# Patient Record
Sex: Female | Born: 1940
Health system: Southern US, Community
[De-identification: ages and names within clinical notes are randomized; demographics above are authoritative.]

## PROBLEM LIST (undated history)

## (undated) DIAGNOSIS — I10 Essential (primary) hypertension: Secondary | ICD-10-CM

## (undated) DIAGNOSIS — T7840XA Allergy, unspecified, initial encounter: Secondary | ICD-10-CM

## (undated) DIAGNOSIS — K5792 Diverticulitis of intestine, part unspecified, without perforation or abscess without bleeding: Secondary | ICD-10-CM

## (undated) DIAGNOSIS — H269 Unspecified cataract: Secondary | ICD-10-CM

## (undated) DIAGNOSIS — M8580 Other specified disorders of bone density and structure, unspecified site: Secondary | ICD-10-CM

## (undated) DIAGNOSIS — E785 Hyperlipidemia, unspecified: Secondary | ICD-10-CM

## (undated) DIAGNOSIS — H409 Unspecified glaucoma: Secondary | ICD-10-CM

## (undated) DIAGNOSIS — M6530 Trigger finger, unspecified finger: Secondary | ICD-10-CM

## (undated) DIAGNOSIS — H9313 Tinnitus, bilateral: Secondary | ICD-10-CM

## (undated) DIAGNOSIS — H35342 Macular cyst, hole, or pseudohole, left eye: Secondary | ICD-10-CM

## (undated) HISTORY — DX: Other specified disorders of bone density and structure, unspecified site: M85.80

## (undated) HISTORY — DX: Unspecified glaucoma: H40.9

## (undated) HISTORY — DX: Trigger finger, unspecified finger: M65.30

## (undated) HISTORY — DX: Tinnitus, bilateral: H93.13

## (undated) HISTORY — DX: Allergy, unspecified, initial encounter: T78.40XA

## (undated) HISTORY — DX: Diverticulitis of intestine, part unspecified, without perforation or abscess without bleeding: K57.92

## (undated) HISTORY — DX: Unspecified cataract: H26.9

## (undated) HISTORY — DX: Hyperlipidemia, unspecified: E78.5

## (undated) HISTORY — PX: TRIGGER FINGER RELEASE: SHX641

## (undated) HISTORY — DX: Macular cyst, hole, or pseudohole, left eye: H35.342

## (undated) HISTORY — DX: Essential (primary) hypertension: I10

## (undated) HISTORY — PX: TUBAL LIGATION: SHX77

---

## 2005-03-05 ENCOUNTER — Ambulatory Visit: Payer: Self-pay | Admitting: Unknown Physician Specialty

## 2006-01-16 ENCOUNTER — Ambulatory Visit: Payer: Self-pay | Admitting: Internal Medicine

## 2007-05-27 DIAGNOSIS — M545 Low back pain, unspecified: Secondary | ICD-10-CM | POA: Insufficient documentation

## 2007-06-03 ENCOUNTER — Ambulatory Visit: Payer: Self-pay | Admitting: Family Medicine

## 2008-11-23 ENCOUNTER — Ambulatory Visit: Payer: Self-pay | Admitting: Family Medicine

## 2008-12-21 LAB — HM COLONOSCOPY

## 2009-01-11 ENCOUNTER — Ambulatory Visit: Payer: Self-pay | Admitting: Gastroenterology

## 2010-05-23 LAB — HM PAP SMEAR: HM PAP: NORMAL

## 2010-06-14 LAB — HM DEXA SCAN: HM Dexa Scan: NORMAL

## 2010-07-12 ENCOUNTER — Ambulatory Visit: Payer: Self-pay | Admitting: Family Medicine

## 2010-09-18 ENCOUNTER — Ambulatory Visit: Payer: Self-pay | Admitting: Family Medicine

## 2011-08-07 ENCOUNTER — Ambulatory Visit: Payer: Self-pay | Admitting: Family Medicine

## 2012-11-19 ENCOUNTER — Ambulatory Visit: Payer: Self-pay | Admitting: Family Medicine

## 2013-11-25 ENCOUNTER — Ambulatory Visit: Payer: Self-pay | Admitting: Family Medicine

## 2013-11-25 LAB — HM MAMMOGRAPHY: HM Mammogram: NORMAL

## 2014-08-10 LAB — LIPID PANEL
CHOLESTEROL: 216 mg/dL — AB (ref 0–200)
HDL: 81 mg/dL — AB (ref 35–70)
LDL CALC: 115 mg/dL
TRIGLYCERIDES: 98 mg/dL (ref 40–160)

## 2014-08-10 LAB — HEMOGLOBIN A1C: Hgb A1c MFr Bld: 6 % (ref 4.0–6.0)

## 2014-12-31 ENCOUNTER — Encounter: Payer: Self-pay | Admitting: Family Medicine

## 2014-12-31 ENCOUNTER — Ambulatory Visit (INDEPENDENT_AMBULATORY_CARE_PROVIDER_SITE_OTHER): Payer: BC Managed Care – PPO | Admitting: Family Medicine

## 2014-12-31 VITALS — BP 122/70 | HR 71 | Temp 98.6°F | Resp 14 | Ht 64.0 in | Wt 137.7 lb

## 2014-12-31 DIAGNOSIS — J309 Allergic rhinitis, unspecified: Secondary | ICD-10-CM | POA: Insufficient documentation

## 2014-12-31 DIAGNOSIS — J3089 Other allergic rhinitis: Secondary | ICD-10-CM

## 2014-12-31 DIAGNOSIS — E785 Hyperlipidemia, unspecified: Secondary | ICD-10-CM | POA: Diagnosis not present

## 2014-12-31 DIAGNOSIS — M1711 Unilateral primary osteoarthritis, right knee: Secondary | ICD-10-CM

## 2014-12-31 DIAGNOSIS — Z1331 Encounter for screening for depression: Secondary | ICD-10-CM | POA: Insufficient documentation

## 2014-12-31 DIAGNOSIS — G5600 Carpal tunnel syndrome, unspecified upper limb: Secondary | ICD-10-CM | POA: Insufficient documentation

## 2014-12-31 DIAGNOSIS — M6530 Trigger finger, unspecified finger: Secondary | ICD-10-CM | POA: Insufficient documentation

## 2014-12-31 DIAGNOSIS — M179 Osteoarthritis of knee, unspecified: Secondary | ICD-10-CM | POA: Insufficient documentation

## 2014-12-31 DIAGNOSIS — I499 Cardiac arrhythmia, unspecified: Secondary | ICD-10-CM | POA: Insufficient documentation

## 2014-12-31 DIAGNOSIS — I10 Essential (primary) hypertension: Secondary | ICD-10-CM

## 2014-12-31 DIAGNOSIS — R7301 Impaired fasting glucose: Secondary | ICD-10-CM

## 2014-12-31 DIAGNOSIS — H269 Unspecified cataract: Secondary | ICD-10-CM | POA: Insufficient documentation

## 2014-12-31 DIAGNOSIS — E2839 Other primary ovarian failure: Secondary | ICD-10-CM | POA: Insufficient documentation

## 2014-12-31 DIAGNOSIS — M8580 Other specified disorders of bone density and structure, unspecified site: Secondary | ICD-10-CM | POA: Insufficient documentation

## 2014-12-31 DIAGNOSIS — M1710 Unilateral primary osteoarthritis, unspecified knee: Secondary | ICD-10-CM | POA: Insufficient documentation

## 2014-12-31 MED ORDER — LOSARTAN POTASSIUM-HCTZ 50-12.5 MG PO TABS: 1.0000 | ORAL_TABLET | Freq: Every day | ORAL | Status: DC

## 2014-12-31 MED ORDER — CRESTOR 5 MG PO TABS: 5.0000 mg | ORAL_TABLET | Freq: Every day | ORAL | Status: DC

## 2014-12-31 MED ORDER — FLUTICASONE PROPIONATE 50 MCG/ACT NA SUSP: 2.0000 | Freq: Every day | NASAL | Status: DC

## 2014-12-31 MED ORDER — ASPIRIN EC 81 MG PO TBEC: 81.0000 mg | DELAYED_RELEASE_TABLET | Freq: Every day | ORAL | Status: AC

## 2014-12-31 MED ORDER — VITAMIN D 50 MCG (2000 UT) PO CAPS: 1.0000 | ORAL_CAPSULE | Freq: Every day | ORAL | Status: DC

## 2014-12-31 MED ORDER — LORATADINE 10 MG PO TABS: 10.0000 mg | ORAL_TABLET | Freq: Every day | ORAL | Status: DC

## 2014-12-31 NOTE — Progress Notes (Signed)
Name: Yvonne Andrews   MRN: [id number]    DOB: May 02, 1941   Date:12/31/2014       Progress Note  Subjective  Chief Complaint  Chief Complaint  Patient presents with  . Hypertension  . Hyperlipidemia  . Knee Pain    right     HPI  HTN:  Patient has been compliant with medication and denies side effects, no chest pain, no palpitation. No dry cough.   Hyperlipidemia: she had labs done 6 months ago, taking Crestor , in the past had elevated LFT's with Lipitor but tolerating Crestor well. Lipid panel at goal  Prediabetes/fasting hyperglycemia: fasting glucose was 104 and hgbA1C was 6.0 in January 2016, she denies polydipsia, polyuria or polyphagia.   OA right knee: she has intermittent pain on right medial knee , present when going down stairs. No effusion, no redness. Pain level at this time is zero. She take ibuprofen prn only, a few times weekly   AR: she notices hoarseness when exposed to fumes and cold air. Symptoms only lasts hours and resolves by itself. No sore throat associated, fever or dysphagia. Symptoms are going on for months. She also has intermittent congestion   Patient Active Problem List   Diagnosis Date Noted  . Benign essential HTN 12/31/2014  . Carpal tunnel syndrome 12/31/2014  . Dyslipidemia 12/31/2014  . Ovarian failure 12/31/2014  . Osteopenia 12/31/2014  . Allergic rhinitis 12/31/2014  . Arthritis of knee, degenerative 12/31/2014  . Acquired trigger finger 12/31/2014  . Cataracts, bilateral 12/31/2014  . LBP (low back pain) 05/27/2007    Past Surgical History  Procedure Laterality Date  . Tubal ligation      Family History  Problem Relation Age of Onset  . Heart disease Mother   . CAD Father   . Heart disease Father   . Cancer Brother     History   Social History  . Marital Status: Married    Spouse Name: N/A  . Number of Children: N/A  . Years of Education: N/A   Occupational History  . Not on file.   Social History Main Topics   . Smoking status: Never Smoker   . Smokeless tobacco: Never Used  . Alcohol Use: No  . Drug Use: No  . Sexual Activity: Not Currently   Other Topics Concern  . Not on file   Social History Narrative  . No narrative on file     Current outpatient prescriptions:  .  Calcium Carbonate 500 MG CHEW, Chew 1 tablet by mouth daily., Disp: , Rfl:  .  CRESTOR 5 MG tablet, Take 1 tablet (5 mg total) by mouth daily., Disp: 30 tablet, Rfl: 5 .  losartan-hydrochlorothiazide (HYZAAR) 50-12.5 MG per tablet, Take 1 tablet by mouth daily., Disp: 30 tablet, Rfl: 5 .  MULTIPLE VITAMINS-MINERALS ER PO, Take 1 tablet by mouth daily., Disp: , Rfl:  .  aspirin EC 81 MG tablet, Take 1 tablet (81 mg total) by mouth daily., Disp: 30 tablet, Rfl: 0 .  Cholecalciferol (VITAMIN D) 2000 UNITS CAPS, Take 1 capsule (2,000 Units total) by mouth daily., Disp: 30 capsule, Rfl: 0  Allergies  Allergen Reactions  . Lipitor [Atorvastatin] Other (See Comments)    Elevation of LFT's     ROS  Constitutional: Negative for fever or weight change.  Respiratory: Negative for cough and shortness of breath.   Cardiovascular: Negative for chest pain or palpitations.  Gastrointestinal: Negative for abdominal pain, no bowel changes.  Musculoskeletal: Negative for  gait problem or joint swelling.  Skin: Negative for rash.  Neurological: Negative for dizziness or headache.  No other specific complaints in a complete review of systems (except as listed in HPI above).  Objective  Filed Vitals:   12/31/14 1059  BP: 122/70  Pulse: 71  Temp: 98.6 F (37 C)  TempSrc: Oral  Resp: 14  Height:  (1.626 m)  Weight: 137 lb 11.2 oz (62.46 kg)  SpO2: 99%    Body mass index is 23.62 kg/(m^2).  Physical Exam  Constitutional: Patient appears well-developed and well-nourished. No distress.  HENT: Head: Normocephalic and atraumatic. Ears: B TMs ok, no erythema or effusion; Nose: Nose normal. Mouth/Throat: Oropharynx is  clear and moist. No oropharyngeal exudate.  Nares boggy turbinates Eyes: Conjunctivae and EOM are normal. Pupils are equal, round, and reactive to light. No scleral icterus.  Neck: Normal range of motion. Neck supple. No JVD present. No thyromegaly present.  Cardiovascular: Normal rate, regular rhythm and normal heart sounds.  No murmur heard. No BLE edema. Pulmonary/Chest: Effort normal and breath sounds normal. No respiratory distress. Abdominal: Soft.There is no tenderness. no masses Musculoskeletal: Normal range of motion, no joint effusions. Crepitus with extension of right knee, neg [place]ray Neurological: he is alert and oriented to person, place, and time. No cranial nerve deficit. Coordination, balance, strength, speech and gait are normal.  Skin: Skin is warm and dry. No rash noted. No erythema.  Psychiatric: Patient has a normal mood and affect. behavior is normal. Judgment and thought content normal.    PHQ2/9: Depression screen PHQ 2/9 12/31/2014  Decreased Interest 0  Down, Depressed, Hopeless 0  PHQ - 2 Score 0     Fall Risk: Fall Risk  12/31/2014  Falls in the past year? No     Assessment & Plan  1. Benign essential HTN  - losartan-hydrochlorothiazide (HYZAAR) 50-12.5 MG per tablet; Take 1 tablet by mouth daily.  Dispense: 30 tablet; Refill: 5  2. Other allergic rhinitis We will add medication and see if hoarseness resolves  fluticasone (FLONASE) 50 MCG/ACT nasal spray; Place 2 sprays into both nostrils daily.  Dispense: 16 g; Refill: 5 - loratadine (CLARITIN) 10 MG tablet; Take 1 tablet (10 mg total) by mouth daily.  Dispense: 30 tablet; Refill: 11  3. Dyslipidemia Continue medication - CRESTOR 5 MG tablet; Take 1 tablet (5 mg total) by mouth daily.  Dispense: 30 tablet; Refill: 5  4. Primary osteoarthritis of right knee Advised Tylenol prn   5. Hyperglycemia: discussed dietary modification and physical activity

## 2014-12-31 NOTE — Patient Instructions (Signed)
Diabetes Mellitus and Food It is important for you to manage your blood sugar (glucose) level. Your blood glucose level can be greatly affected by what you eat. Eating healthier foods in the appropriate amounts throughout the day at about the same time each day will help you control your blood glucose level. It can also help slow or prevent worsening of your diabetes mellitus. Healthy eating may even help you improve the level of your blood pressure and reach or maintain a healthy weight.  HOW CAN FOOD AFFECT ME? Carbohydrates Carbohydrates affect your blood glucose level more than any other type of food. Your dietitian will help you determine how many carbohydrates to eat at each meal and teach you how to count carbohydrates. Counting carbohydrates is important to keep your blood glucose at a healthy level, especially if you are using insulin or taking certain medicines for diabetes mellitus. Alcohol Alcohol can cause sudden decreases in blood glucose (hypoglycemia), especially if you use insulin or take certain medicines for diabetes mellitus. Hypoglycemia can be a life-threatening condition. Symptoms of hypoglycemia (sleepiness, dizziness, and disorientation) are similar to symptoms of having too much alcohol.  If your health care provider has given you approval to drink alcohol, do so in moderation and use the following guidelines:  Women should not have more than one drink per day, and men should not have more than two drinks per day. One drink is equal to:  12 oz of beer.  5 oz of wine.  1 oz of hard liquor.  Do not drink on an empty stomach.  Keep yourself hydrated. Have water, diet soda, or unsweetened iced tea.  Regular soda, juice, and other mixers might contain a lot of carbohydrates and should be counted. WHAT FOODS ARE NOT RECOMMENDED? As you make food choices, it is important to remember that all foods are not the same. Some foods have fewer nutrients per serving than other  foods, even though they might have the same number of calories or carbohydrates. It is difficult to get your body what it needs when you eat foods with fewer nutrients. Examples of foods that you should avoid that are high in calories and carbohydrates but low in nutrients include:  Trans fats (most processed foods list trans fats on the Nutrition Facts label).  Regular soda.  Juice.  Candy.  Sweets, such as cake, pie, doughnuts, and cookies.  Fried foods. WHAT FOODS CAN I EAT? Have nutrient-rich foods, which will nourish your body and keep you healthy. The food you should eat also will depend on several factors, including:  The calories you need.  The medicines you take.  Your weight.  Your blood glucose level.  Your blood pressure level.  Your cholesterol level. You also should eat a variety of foods, including:  Protein, such as meat, poultry, fish, tofu, nuts, and seeds (lean animal proteins are best).  Fruits.  Vegetables.  Dairy products, such as milk, cheese, and yogurt (low fat is best).  Breads, grains, pasta, cereal, rice, and beans.  Fats such as olive oil, trans fat-free margarine, canola oil, avocado, and olives. DOES EVERYONE WITH DIABETES MELLITUS HAVE THE SAME MEAL PLAN? Because every person with diabetes mellitus is different, there is not one meal plan that works for everyone. It is very important that you meet with a dietitian who will help you create a meal plan that is just right for you. Document Released: 04/05/2005 Document Revised: 07/14/2013 Document Reviewed: 06/05/2013 [employer] Patient Information 2015 [employer]. This   information is not intended to replace advice given to you by your health care provider. Make sure you discuss any questions you have with your health care provider.  

## 2015-01-04 ENCOUNTER — Other Ambulatory Visit: Payer: Self-pay | Admitting: Family Medicine

## 2015-01-04 DIAGNOSIS — Z1231 Encounter for screening mammogram for malignant neoplasm of breast: Secondary | ICD-10-CM

## 2015-01-12 ENCOUNTER — Other Ambulatory Visit: Payer: Self-pay | Admitting: Family Medicine

## 2015-01-12 ENCOUNTER — Ambulatory Visit
Admission: RE | Admit: 2015-01-12 | Discharge: 2015-01-12 | Disposition: A | Discharge status: Home / Self Care | Payer: BC Managed Care – PPO | Source: Ambulatory Visit | Attending: Family Medicine | Admitting: Family Medicine

## 2015-01-12 DIAGNOSIS — Z1231 Encounter for screening mammogram for malignant neoplasm of breast: Secondary | ICD-10-CM

## 2015-05-04 ENCOUNTER — Ambulatory Visit (INDEPENDENT_AMBULATORY_CARE_PROVIDER_SITE_OTHER): Payer: BC Managed Care – PPO

## 2015-05-04 DIAGNOSIS — Z23 Encounter for immunization: Secondary | ICD-10-CM

## 2015-06-26 ENCOUNTER — Other Ambulatory Visit: Payer: Self-pay | Admitting: Family Medicine

## 2015-07-04 ENCOUNTER — Ambulatory Visit (INDEPENDENT_AMBULATORY_CARE_PROVIDER_SITE_OTHER): Payer: Medicare Other | Admitting: Family Medicine

## 2015-07-04 ENCOUNTER — Encounter: Payer: Self-pay | Admitting: Family Medicine

## 2015-07-04 VITALS — BP 138/78 | HR 74 | Temp 97.8°F | Resp 16 | Ht 64.0 in | Wt 140.9 lb

## 2015-07-04 DIAGNOSIS — H9313 Tinnitus, bilateral: Secondary | ICD-10-CM | POA: Diagnosis not present

## 2015-07-04 DIAGNOSIS — R7301 Impaired fasting glucose: Secondary | ICD-10-CM

## 2015-07-04 DIAGNOSIS — M1711 Unilateral primary osteoarthritis, right knee: Secondary | ICD-10-CM

## 2015-07-04 DIAGNOSIS — E785 Hyperlipidemia, unspecified: Secondary | ICD-10-CM | POA: Diagnosis not present

## 2015-07-04 DIAGNOSIS — H40051 Ocular hypertension, right eye: Secondary | ICD-10-CM | POA: Diagnosis not present

## 2015-07-04 DIAGNOSIS — Z79899 Other long term (current) drug therapy: Secondary | ICD-10-CM

## 2015-07-04 DIAGNOSIS — I10 Essential (primary) hypertension: Secondary | ICD-10-CM | POA: Diagnosis not present

## 2015-07-04 DIAGNOSIS — H40059 Ocular hypertension, unspecified eye: Secondary | ICD-10-CM | POA: Insufficient documentation

## 2015-07-04 MED ORDER — LOSARTAN POTASSIUM-HCTZ 50-12.5 MG PO TABS: 1.0000 | ORAL_TABLET | Freq: Every day | ORAL | Status: DC

## 2015-07-04 MED ORDER — PRAVASTATIN SODIUM 40 MG PO TABS: 40.0000 mg | ORAL_TABLET | Freq: Every day | ORAL | Status: DC

## 2015-07-04 NOTE — Progress Notes (Signed)
Name: Yvonne Andrews   MRN: [id number]    DOB: 10-May-1941   Date:07/04/2015       Progress Note  Subjective  Chief Complaint  Chief Complaint  Patient presents with  . Medication Refill    follow-up  . Hypertension  . Hyperlipidemia    HPI  HTN: Patient has been compliant with medication and denies side effects, no chest pain, no palpitation, no dry cough. BP at home is well controlled, usually around 125/80's.   Hyperlipidemia: she had labs done 1 years ago, taking Crestor , in the past had elevated LFT's with Lipitor but tolerating Crestor well. Lipid panel at goal. She had insurance change and Crestor is tier 3 , she would like to change to another medication, discussed Pravastatin, and we will recheck liver enzymes today and on her next visit  Prediabetes/fasting hyperglycemia: fasting glucose was 104 and hgbA1C was 6.0 in January 2016, she denies polydipsia, polyuria or polyphagia. Not as active now because she retired the end of September, but will try to join [employer] in January.   OA right knee: she has intermittent pain on right medial knee , and over the right patella  when going down stairs. No effusion, no redness. Pain level at this time is zero. She take ibuprofen prn only, a few times weekly   Increase IO pressure: using eye drops, and has follow up with Dr. Margaretmary DysPortofilio in January  Patient Active Problem List   Diagnosis Date Noted  . Intraocular pressure increase 07/04/2015  . Benign essential HTN 12/31/2014  . Carpal tunnel syndrome 12/31/2014  . Dyslipidemia 12/31/2014  . Ovarian failure 12/31/2014  . Osteopenia 12/31/2014  . Allergic rhinitis 12/31/2014  . Arthritis of knee, degenerative 12/31/2014  . Acquired trigger finger 12/31/2014  . Cataracts, bilateral 12/31/2014  . Fasting hyperglycemia 12/31/2014  . LBP (low back pain) 05/27/2007    Past Surgical History  Procedure Laterality Date  . Tubal ligation      Family History  Problem Relation  Age of Onset  . Heart disease Mother   . CAD Father   . Heart disease Father   . Cancer Brother     Social History   Social History  . Marital Status: Married    Spouse Name: N/A  . Number of Children: N/A  . Years of Education: N/A   Occupational History  . Not on file.   Social History Main Topics  . Smoking status: Never Smoker   . Smokeless tobacco: Never Used  . Alcohol Use: No  . Drug Use: No  . Sexual Activity: Not Currently   Other Topics Concern  . Not on file   Social History Narrative     Current outpatient prescriptions:  .  aspirin EC 81 MG tablet, Take 1 tablet (81 mg total) by mouth daily., Disp: 30 tablet, Rfl: 0 .  Calcium Carbonate 500 MG CHEW, Chew 1 tablet by mouth daily., Disp: , Rfl:  .  Cholecalciferol (VITAMIN D) 2000 UNITS CAPS, Take 1 capsule (2,000 Units total) by mouth daily., Disp: 30 capsule, Rfl: 0 .  losartan-hydrochlorothiazide (HYZAAR) 50-12.5 MG tablet, Take 1 tablet by mouth daily., Disp: 90 tablet, Rfl: 1 .  MULTIPLE VITAMINS-MINERALS ER PO, Take 1 tablet by mouth daily., Disp: , Rfl:  .  pravastatin (PRAVACHOL) 40 MG tablet, Take 1 tablet (40 mg total) by mouth daily., Disp: 90 tablet, Rfl: 1 .  travoprost, benzalkonium, (TRAVATAN) 0.004 % ophthalmic solution, USE 1 DROP(S) IN RIGHT EYE  ONCE IN THE EVENING FOR 30 DAYS, Disp: , Rfl: 5  Allergies  Allergen Reactions  . Lipitor [Atorvastatin] Other (See Comments)    Elevation of LFT's     ROS  Constitutional: Negative for fever or significant  weight change.  Respiratory: Negative for cough and shortness of breath.   Cardiovascular: Negative for chest pain or palpitations.  Gastrointestinal: Negative for abdominal pain, no bowel changes.  Musculoskeletal: Negative for gait problem or joint swelling.  Skin: Negative for rash.  Neurological: Negative for dizziness or headache.  No other specific complaints in a complete review of systems (except as listed in HPI  above).  Objective  Filed Vitals:   07/04/15 0804  BP: 138/78  Pulse: 74  Temp: 97.8 F (36.6 C)  TempSrc: Oral  Resp: 16  Height:  (1.626 m)  Weight: 140 lb 14.4 oz (63.912 kg)  SpO2: 99%    Body mass index is 24.17 kg/(m^2).  Physical Exam  Constitutional: Patient appears well-developed and well-nourished. Obese  No distress.  HEENT: head atraumatic, normocephalic, pupils equal and reactive to light, neck supple, throat within normal limits Cardiovascular: Normal rate, regular rhythm and normal heart sounds.  No murmur heard. No BLE edema. Pulmonary/Chest: Effort normal and breath sounds normal. No respiratory distress. Abdominal: Soft.  There is no tenderness. Psychiatric: Patient has a normal mood and affect. behavior is normal. Judgment and thought content normal.   PHQ2/9: Depression screen [facility] 2/9 07/04/2015 12/31/2014  Decreased Interest 0 0  Down, Depressed, Hopeless 0 0  PHQ - 2 Score 0 0    Fall Risk: Fall Risk  07/04/2015 12/31/2014  Falls in the past year? No No     Functional Status Survey: Is the patient deaf or have difficulty hearing?: No Does the patient have difficulty seeing, even when wearing glasses/contacts?: Yes (glasses) Does the patient have difficulty concentrating, remembering, or making decisions?: No Does the patient have difficulty walking or climbing stairs?: No Does the patient have difficulty dressing or bathing?: No Does the patient have difficulty doing errands alone such as visiting a doctor's office or shopping?: No    Assessment & Plan  1. Benign essential HTN  - losartan-hydrochlorothiazide (HYZAAR) 50-12.5 MG tablet; Take 1 tablet by mouth daily.  Dispense: 90 tablet; Refill: 1  2. Primary osteoarthritis of right knee  Resume physical activity, continue prn Tylenol   3. Fasting hyperglycemia  - Hemoglobin A1c  4. Dyslipidemia  - Lipid panel - pravastatin (PRAVACHOL) 40 MG tablet; Take 1 tablet (40 mg  total) by mouth daily.  Dispense: 90 tablet; Refill: 1  5. Long-term use of high-risk medication  - AST - ALT - Estimated GFR  6. Intraocular pressure increase, right  Continue follow up with ophthalmologist   7. Bilateral tinnitus  Chronic, going on for many years. She thinks there is mild hearing loss, advised to see ENT, but she wants to hold off  for now. Discussed white noise

## 2015-07-24 DIAGNOSIS — H35342 Macular cyst, hole, or pseudohole, left eye: Secondary | ICD-10-CM

## 2015-07-24 HISTORY — DX: Macular cyst, hole, or pseudohole, left eye: H35.342

## 2015-08-07 ENCOUNTER — Other Ambulatory Visit: Payer: Self-pay | Admitting: Family Medicine

## 2015-08-10 ENCOUNTER — Other Ambulatory Visit: Payer: Self-pay

## 2015-08-10 MED ORDER — LOSARTAN POTASSIUM-HCTZ 50-12.5 MG PO TABS: 1.0000 | ORAL_TABLET | Freq: Every day | ORAL | Status: DC

## 2015-08-10 NOTE — Telephone Encounter (Signed)
Got a fax from [facility] ([address]) requesting a 90 day supply of this patient's Hyzaar 50/12.5.  Refill request was sent to Dr. Alba Cory for approval and submission.

## 2015-08-12 LAB — GLOM FILT RATE, ESTIMATED
CREATININE: 0.92 mg/dL (ref 0.57–1.00)
GFR calc Af Amer: 72 mL/min/{1.73_m2} (ref >59)
GFR, EST NON AFRICAN AMERICAN: 62 mL/min/{1.73_m2} (ref >59)

## 2015-08-12 LAB — AST: AST: 22 IU/L (ref 0–40)

## 2015-08-12 LAB — LIPID PANEL
CHOLESTEROL TOTAL: 193 mg/dL (ref 100–199)
Chol/HDL Ratio: 2.4 ratio units (ref 0.0–4.4)
HDL: 82 mg/dL (ref >39)
LDL Calculated: 96 mg/dL (ref 0–99)
TRIGLYCERIDES: 73 mg/dL (ref 0–149)
VLDL CHOLESTEROL CAL: 15 mg/dL (ref 5–40)

## 2015-08-12 LAB — HEMOGLOBIN A1C
Est. average glucose Bld gHb Est-mCnc: 128 mg/dL
HEMOGLOBIN A1C: 6.1 % — AB (ref 4.8–5.6)

## 2015-08-12 LAB — ALT: ALT: 13 IU/L (ref 0–32)

## 2015-09-04 ENCOUNTER — Other Ambulatory Visit: Payer: Self-pay | Admitting: Family Medicine

## 2015-09-19 ENCOUNTER — Ambulatory Visit (INDEPENDENT_AMBULATORY_CARE_PROVIDER_SITE_OTHER): Payer: Medicare Other | Admitting: Family Medicine

## 2015-09-19 ENCOUNTER — Encounter: Payer: Self-pay | Admitting: Family Medicine

## 2015-09-19 VITALS — BP 126/68 | HR 65 | Temp 98.0°F | Resp 16 | Ht 64.0 in | Wt 143.0 lb

## 2015-09-19 DIAGNOSIS — Z0000 Encounter for general adult medical examination without abnormal findings: Secondary | ICD-10-CM | POA: Diagnosis not present

## 2015-09-19 DIAGNOSIS — E2839 Other primary ovarian failure: Secondary | ICD-10-CM

## 2015-09-19 DIAGNOSIS — Z1239 Encounter for other screening for malignant neoplasm of breast: Secondary | ICD-10-CM

## 2015-09-19 DIAGNOSIS — E785 Hyperlipidemia, unspecified: Secondary | ICD-10-CM | POA: Diagnosis not present

## 2015-09-19 DIAGNOSIS — Z79899 Other long term (current) drug therapy: Secondary | ICD-10-CM | POA: Diagnosis not present

## 2015-09-19 NOTE — Patient Instructions (Signed)
  Ms. Gitlin , Thank you for taking time to come for your Medicare Wellness Visit. I appreciate your ongoing commitment to your health goals. Please review the following plan we discussed and let me know if I can assist you in the future.   These are the goals we discussed:  Check bone density coverage with insurance   This is a list of the screening recommended for you and due dates:  Health Maintenance  Topic Date Due  . Flu Shot  02/21/2016  . Mammogram  01/11/2017  . Colon Cancer Screening  12/22/2018  . Tetanus Vaccine  09/26/2020  . DEXA scan (bone density measurement)  Completed  . Shingles Vaccine  Completed  . Pneumonia vaccines  Completed

## 2015-09-19 NOTE — Progress Notes (Signed)
Name: Yvonne Andrews   MRN: [id number]    DOB: February 24, 1941   Date:09/19/2015       Progress Note  Subjective  Chief Complaint  Chief Complaint  Patient presents with  . Annual Exam    HPI  Functional ability/safety issues: No Issues - discussed light nights.  Hearing issues: Addressed  Activities of daily living: Discussed Home safety issues: No Issues  End Of Life Planning: Offered verbal information regarding advanced directives, healthcare power of attorney.  Preventative care, Health maintenance, Preventative health measures discussed.  Preventative screenings discussed today: lab work, colonoscopy, PAP - discussed USPTF and we will not longer check it, mammogram, DEXA.  Low Dose CT Chest recommended if Age 5-80 years, 30 pack-year currently smoking OR have quit w/in 15years.   Lifestyle risk factor issued reviewed: Diet, exercise, weight management, advised patient smoking is not healthy, nutrition/diet.  Preventative health measures discussed (5-10 year plan).  Reviewed and recommended vaccinations: - Pneumovax  - Prevnar  - Annual Influenza - Zostavax - Tdap   Depression screening: Done Fall risk screening: Done Discuss ADLs/IADLs: Done  Current medical providers: See HPI  Other health risk factors identified this visit: No other issues Cognitive impairment issues: None identified  All above discussed with patient. Appropriate education, counseling and referral will be made based upon the above.   Hyperlipidemia: we changed from Crestor to Pravastatin this month because of cost. We will recheck labs in a couple of months to make sure it is at goal . No chest pain or SOB, no decrease in exercise tolerance.  Patient Active Problem List   Diagnosis Date Noted  . Intraocular pressure increase 07/04/2015  . Bilateral tinnitus 07/04/2015  . Benign essential HTN 12/31/2014  . Carpal tunnel syndrome 12/31/2014  . Dyslipidemia 12/31/2014  . Ovarian failure  12/31/2014  . Osteopenia 12/31/2014  . Allergic rhinitis 12/31/2014  . Arthritis of knee, degenerative 12/31/2014  . Acquired trigger finger 12/31/2014  . Cataracts, bilateral 12/31/2014  . Fasting hyperglycemia 12/31/2014  . LBP (low back pain) 05/27/2007    Past Surgical History  Procedure Laterality Date  . Tubal ligation    . Trigger finger release      Family History  Problem Relation Age of Onset  . Heart disease Mother   . CAD Father   . Heart disease Father   . Cancer Brother     Social History   Social History  . Marital Status: Married    Spouse Name: N/A  . Number of Children: N/A  . Years of Education: N/A   Occupational History  . Not on file.   Social History Main Topics  . Smoking status: Never Smoker   . Smokeless tobacco: Never Used  . Alcohol Use: No  . Drug Use: No  . Sexual Activity: Not Currently   Other Topics Concern  . Not on file   Social History Narrative     Current outpatient prescriptions:  .  aspirin EC 81 MG tablet, Take 1 tablet (81 mg total) by mouth daily., Disp: 30 tablet, Rfl: 0 .  Calcium Carbonate 500 MG CHEW, Chew 1 tablet by mouth daily., Disp: , Rfl:  .  Cholecalciferol (VITAMIN D) 2000 UNITS CAPS, Take 1 capsule (2,000 Units total) by mouth daily., Disp: 30 capsule, Rfl: 0 .  latanoprost (XALATAN) 0.005 % ophthalmic solution, TAKE 1 DROP IN RIGHT EYE ONCE IN THE EVENING, Disp: , Rfl: 5 .  losartan-hydrochlorothiazide (HYZAAR) 50-12.5 MG tablet, TAKE 1 TABLET BY  MOUTH DAILY., Disp: 30 tablet, Rfl: 0 .  MULTIPLE VITAMINS-MINERALS ER PO, Take 1 tablet by mouth daily., Disp: , Rfl:  .  pravastatin (PRAVACHOL) 40 MG tablet, Take 1 tablet (40 mg total) by mouth daily., Disp: 90 tablet, Rfl: 1  Allergies  Allergen Reactions  . Lipitor [Atorvastatin] Other (See Comments)    Elevation of LFT's     ROS  Constitutional: Negative for fever or weight change.  Respiratory: Negative for cough and shortness of breath.    Cardiovascular: Negative for chest pain or palpitations.  Gastrointestinal: Negative for abdominal pain, no bowel changes.  Musculoskeletal: Negative for gait problem or joint swelling.  Skin: Negative for rash.  Neurological: Negative for dizziness or headache.  No other specific complaints in a complete review of systems (except as listed in HPI above).  Objective  Filed Vitals:   09/19/15 0828  BP: 126/68  Pulse: 65  Temp: 98 F (36.7 C)  TempSrc: Oral  Resp: 16  Height:  (1.626 m)  Weight: 143 lb (64.864 kg)  SpO2: 99%    Body mass index is 24.53 kg/(m^2).  Physical Exam  Constitutional: Patient appears well-developed and well-nourished. No distress.  HENT: Head: Normocephalic and atraumatic. Ears: B TMs ok, no erythema or effusion; Nose: Nose normal. Mouth/Throat: Oropharynx is clear and moist. No oropharyngeal exudate.  Eyes: Conjunctivae and EOM are normal. Pupils are equal, round, and reactive to light. No scleral icterus.  Neck: Normal range of motion. Neck supple. No JVD present. No thyromegaly present.  Cardiovascular: Normal rate, regular rhythm and normal heart sounds.  No murmur heard. No BLE edema. Pulmonary/Chest: Effort normal and breath sounds normal. No respiratory distress. Abdominal: Soft. Bowel sounds are normal, no distension. There is no tenderness. no masses Breast: no lumps or masses, no nipple discharge or rashes FEMALE GENITALIA:  External genitalia normal External urethra normal Vaginal atrophy Pelvic exam not done RECTAL: not done Musculoskeletal: Normal range of motion, no joint effusions. No gross deformities Neurological: he is alert and oriented to person, place, and time. No cranial nerve deficit. Coordination, balance, strength, speech and gait are normal.  Skin: Skin is warm and dry. No rash noted. No erythema.  Psychiatric: Patient has a normal mood and affect. behavior is normal. Judgment and thought content normal.  Recent  Results (from the past 2160 hour(s))  Lipid panel     Status: None   Collection Time: 08/11/15  8:13 AM  Result Value Ref Range   Cholesterol, Total 193 100 - 199 mg/dL   Triglycerides 73 0 - 149 mg/dL   HDL 82 >16 mg/dL   VLDL Cholesterol Cal 15 5 - 40 mg/dL   LDL Calculated 96 0 - 99 mg/dL   Chol/HDL Ratio 2.4 0.0 - 4.4 ratio units    Comment:                                   T. Chol/HDL Ratio                                             Men  Women                               1/2 Avg.Risk  3.4  3.3                                   Avg.Risk  5.0    4.4                                2X Avg.Risk  9.6    7.1                                3X Avg.Risk 23.4   11.0   Hemoglobin A1c     Status: Abnormal   Collection Time: 08/11/15  8:13 AM  Result Value Ref Range   Hgb A1c MFr Bld 6.1 (H) 4.8 - 5.6 %    Comment:          Pre-diabetes: 5.7 - 6.4          Diabetes: >6.4          Glycemic control for adults with diabetes: <7.0    Est. average glucose Bld gHb Est-mCnc 128 mg/dL  AST     Status: None   Collection Time: 08/11/15  8:13 AM  Result Value Ref Range   AST 22 0 - 40 IU/L  ALT     Status: None   Collection Time: 08/11/15  8:13 AM  Result Value Ref Range   ALT 13 0 - 32 IU/L  Glom Filt Rate, Estimated     Status: None   Collection Time: 08/11/15  8:13 AM  Result Value Ref Range   Creatinine, Ser 0.92 0.57 - 1.00 mg/dL   GFR calc non Af Amer 62 >59 mL/min/1.73   GFR calc Af Amer 72 >59 mL/min/1.73     PHQ2/9: Depression screen [facility] 2/9 09/19/2015 07/04/2015 12/31/2014  Decreased Interest 0 0 0  Down, Depressed, Hopeless 0 0 0  PHQ - 2 Score 0 0 0    Fall Risk: Fall Risk  09/19/2015 07/04/2015 12/31/2014  Falls in the past year? No No No    Functional Status Survey: Is the patient deaf or have difficulty hearing?: No Does the patient have difficulty seeing, even when wearing glasses/contacts?: No Does the patient have difficulty concentrating, remembering, or  making decisions?: No Does the patient have difficulty walking or climbing stairs?: No Does the patient have difficulty dressing or bathing?: No Does the patient have difficulty doing errands alone such as visiting a doctor's office or shopping?: No    Assessment & Plan  1. Medicare annual wellness visit, subsequent  Discussed importance of 150 minutes of physical activity weekly, eat two servings of fish weekly, eat one serving of tree nuts ( cashews, pistachios, pecans, almonds.Marland Kitchen) every other day, eat 6 servings of fruit/vegetables daily and drink plenty of water and avoid sweet beverages.   2. Dyslipidemia  - Lipid panel  3. Long-term use of high-risk medication  - AST - ALT  4. Breast cancer screening  - MM Digital Screening; Future  5. Ovarian failure  - DG Bone Density; Future

## 2015-10-02 ENCOUNTER — Other Ambulatory Visit: Payer: Self-pay | Admitting: Family Medicine

## 2015-11-11 DIAGNOSIS — Z79899 Other long term (current) drug therapy: Secondary | ICD-10-CM | POA: Diagnosis not present

## 2015-11-11 DIAGNOSIS — E785 Hyperlipidemia, unspecified: Secondary | ICD-10-CM | POA: Diagnosis not present

## 2015-11-12 LAB — AST: AST: 18 IU/L (ref 0–40)

## 2015-11-12 LAB — LIPID PANEL
CHOL/HDL RATIO: 3 ratio (ref 0.0–4.4)
Cholesterol, Total: 230 mg/dL — ABNORMAL HIGH (ref 100–199)
HDL: 77 mg/dL (ref >39)
LDL CALC: 135 mg/dL — AB (ref 0–99)
Triglycerides: 89 mg/dL (ref 0–149)
VLDL CHOLESTEROL CAL: 18 mg/dL (ref 5–40)

## 2015-11-12 LAB — ALT: ALT: 14 IU/L (ref 0–32)

## 2015-11-15 ENCOUNTER — Other Ambulatory Visit: Payer: Self-pay | Admitting: Family Medicine

## 2015-11-15 MED ORDER — ROSUVASTATIN CALCIUM 10 MG PO TABS: 10.0000 mg | ORAL_TABLET | Freq: Every day | ORAL | Status: DC

## 2015-11-16 ENCOUNTER — Other Ambulatory Visit: Payer: Self-pay | Admitting: Family Medicine

## 2015-11-16 DIAGNOSIS — Z79899 Other long term (current) drug therapy: Secondary | ICD-10-CM

## 2015-11-17 ENCOUNTER — Ambulatory Visit (INDEPENDENT_AMBULATORY_CARE_PROVIDER_SITE_OTHER): Payer: Medicare Other | Admitting: Family Medicine

## 2015-11-17 ENCOUNTER — Other Ambulatory Visit: Payer: Self-pay

## 2015-11-17 ENCOUNTER — Encounter: Payer: Self-pay | Admitting: Family Medicine

## 2015-11-17 VITALS — BP 118/68 | HR 69 | Temp 98.5°F | Resp 18 | Ht 64.0 in | Wt 143.3 lb

## 2015-11-17 DIAGNOSIS — E785 Hyperlipidemia, unspecified: Secondary | ICD-10-CM

## 2015-11-17 DIAGNOSIS — M1711 Unilateral primary osteoarthritis, right knee: Secondary | ICD-10-CM

## 2015-11-17 DIAGNOSIS — Z79899 Other long term (current) drug therapy: Secondary | ICD-10-CM

## 2015-11-17 DIAGNOSIS — H40051 Ocular hypertension, right eye: Secondary | ICD-10-CM

## 2015-11-17 DIAGNOSIS — R7301 Impaired fasting glucose: Secondary | ICD-10-CM | POA: Diagnosis not present

## 2015-11-17 DIAGNOSIS — I10 Essential (primary) hypertension: Secondary | ICD-10-CM

## 2015-11-17 MED ORDER — LOSARTAN POTASSIUM 50 MG PO TABS: 50.0000 mg | ORAL_TABLET | Freq: Every day | ORAL | Status: DC

## 2015-11-17 NOTE — Progress Notes (Signed)
Name: Yvonne Andrews   MRN: [id number]    DOB: [birth year]   Date:11/17/2015       Progress Note  Subjective  Chief Complaint  Chief Complaint  Patient presents with  . Hypertension    pt here for 4 month follow up    HPI  HTN: Patient has been compliant with medication and denies side effects, no chest pain, no palpitation, no dry cough. BP at home is running around 120's/60's, but 113/60 this morning.   Hyperlipidemia: she had labs done 1 years ago, taking Crestor , in the past had elevated LFT's with Lipitor but tolerating Crestor well. Lipid panel at goal. She had insurance change and Crestor is tier 3 , we changed to pravastatin because of cost, but LDL went up and she is back on Crestor 10 mg now - discussed trying half daily or take one every other day to off set cost  Prediabetes/fasting hyperglycemia: fasting glucose was 104 and hgbA1C was 6.1 this past January.  She denies polydipsia, polyuria or polyphagia.She has been walking more.   OA right knee: she has intermittent pain on right medial knee , and over the right patella when going down stairs. No effusion, no redness. Pain level at this time is zero. She take Tylenol prn very seldom.  Increase IO pressure: using eye drops, and has follow up with Dr. Margaretmary Dys coming up soon.   Patient Active Problem List   Diagnosis Date Noted  . Intraocular pressure increase 07/04/2015  . Bilateral tinnitus 07/04/2015  . Benign essential HTN 12/31/2014  . Carpal tunnel syndrome 12/31/2014  . Dyslipidemia 12/31/2014  . Ovarian failure 12/31/2014  . Osteopenia 12/31/2014  . Allergic rhinitis 12/31/2014  . Arthritis of knee, degenerative 12/31/2014  . Acquired trigger finger 12/31/2014  . Cataracts, bilateral 12/31/2014  . Fasting hyperglycemia 12/31/2014  . LBP (low back pain) 05/27/2007    Past Surgical History  Procedure Laterality Date  . Tubal ligation    . Trigger finger release      Family History  Problem Relation  Age of Onset  . Heart disease Mother   . CAD Father   . Heart disease Father   . Cancer Brother     Social History   Social History  . Marital Status: Married    Spouse Name: N/A  . Number of Children: N/A  . Years of Education: N/A   Occupational History  . Not on file.   Social History Main Topics  . Smoking status: Never Smoker   . Smokeless tobacco: Never Used  . Alcohol Use: No  . Drug Use: No  . Sexual Activity: Not Currently   Other Topics Concern  . Not on file   Social History Narrative     Current outpatient prescriptions:  .  aspirin EC 81 MG tablet, Take 1 tablet (81 mg total) by mouth daily., Disp: 30 tablet, Rfl: 0 .  Calcium Carbonate 500 MG CHEW, Chew 1 tablet by mouth daily., Disp: , Rfl:  .  Cholecalciferol (VITAMIN D) 2000 UNITS CAPS, Take 1 capsule (2,000 Units total) by mouth daily., Disp: 30 capsule, Rfl: 0 .  latanoprost (XALATAN) 0.005 % ophthalmic solution, TAKE 1 DROP IN RIGHT EYE ONCE IN THE EVENING, Disp: , Rfl: 5 .  losartan (COZAAR) 50 MG tablet, Take 1 tablet (50 mg total) by mouth daily., Disp: 90 tablet, Rfl: 3 .  MULTIPLE VITAMINS-MINERALS ER PO, Take 1 tablet by mouth daily., Disp: , Rfl:  .  rosuvastatin (  CRESTOR) 10 MG tablet, , Disp: , Rfl:   Allergies  Allergen Reactions  . Lipitor [Atorvastatin] Other (See Comments)    Elevation of LFT's     ROS  Ten systems reviewed and is negative except as mentioned in HPI   Objective  Filed Vitals:   11/17/15 0954  BP: 118/68  Pulse: 69  Temp: 98.5 F (36.9 C)  Resp: 18  Height:  (1.626 m)  Weight: 143 lb 5 oz (65.006 kg)  SpO2: 98%    Body mass index is 24.59 kg/(m^2).  Physical Exam  Constitutional: Patient appears well-developed and well-nourished.  No distress.  HEENT: head atraumatic, normocephalic, pupils equal and reactive to light,neck supple, throat within normal limits Cardiovascular: Normal rate, regular rhythm and normal heart sounds.  No murmur heard.  No BLE edema. Pulmonary/Chest: Effort normal and breath sounds normal. No respiratory distress. Abdominal: Soft.  There is no tenderness. Psychiatric: Patient has a normal mood and affect. behavior is normal. Judgment and thought content normal.  Recent Results (from the past 2160 hour(s))  Lipid panel     Status: Abnormal   Collection Time: 11/11/15  8:31 AM  Result Value Ref Range   Cholesterol, Total 230 (H) 100 - 199 mg/dL   Triglycerides 89 0 - 149 mg/dL   HDL 77 >16 mg/dL   VLDL Cholesterol Cal 18 5 - 40 mg/dL   LDL Calculated 109 (H) 0 - 99 mg/dL   Chol/HDL Ratio 3.0 0.0 - 4.4 ratio units    Comment:                                   T. Chol/HDL Ratio                                             Men  Women                               1/2 Avg.Risk  3.4    3.3                                   Avg.Risk  5.0    4.4                                2X Avg.Risk  9.6    7.1                                3X Avg.Risk 23.4   11.0   AST     Status: None   Collection Time: 11/11/15  8:31 AM  Result Value Ref Range   AST 18 0 - 40 IU/L  ALT     Status: None   Collection Time: 11/11/15  8:31 AM  Result Value Ref Range   ALT 14 0 - 32 IU/L     PHQ2/9: Depression screen [facility] 2/9 11/17/2015 09/19/2015 07/04/2015 12/31/2014  Decreased Interest 0 0 0 0  Down, Depressed, Hopeless 0 0 0 0  PHQ - 2 Score 0 0 0 0  Fall Risk: Fall Risk  11/17/2015 09/19/2015 07/04/2015 12/31/2014  Falls in the past year? No No No No    Functional Status Survey: Is the patient deaf or have difficulty hearing?: No Does the patient have difficulty seeing, even when wearing glasses/contacts?: No Does the patient have difficulty concentrating, remembering, or making decisions?: No Does the patient have difficulty walking or climbing stairs?: No Does the patient have difficulty dressing or bathing?: No Does the patient have difficulty doing errands alone such as visiting a doctor's office or shopping?:  No    Assessment & Plan  1. Benign essential HTN  Decreasing dose of bp medication, from Losartan/hctz to plain losartan because bp is towards low end of normal  - losartan (COZAAR) 50 MG tablet; Take 1 tablet (50 mg total) by mouth daily.  Dispense: 90 tablet; Refill: 3  2. Dyslipidemia  She is on Crestor 10 mg , used to be on  5mg  and was doing well, Pravastatin did not work, recheck AST and ALT in one month since we switched dose again and also medication and she has a previous history of elevated LFT's with Lipitor  3. Intraocular pressure increase, right  Keep with eye exams and mediation   4. Primary osteoarthritis of right knee  Continue walking and prn Tylenol

## 2015-12-01 ENCOUNTER — Other Ambulatory Visit: Payer: Self-pay | Admitting: Family Medicine

## 2015-12-13 DIAGNOSIS — Z79899 Other long term (current) drug therapy: Secondary | ICD-10-CM | POA: Diagnosis not present

## 2015-12-14 LAB — ALT: ALT: 14 IU/L (ref 0–32)

## 2015-12-14 LAB — AST: AST: 19 IU/L (ref 0–40)

## 2016-01-04 ENCOUNTER — Other Ambulatory Visit: Payer: Self-pay | Admitting: Family Medicine

## 2016-01-04 NOTE — Telephone Encounter (Signed)
Patient requesting refill. 

## 2016-01-16 ENCOUNTER — Ambulatory Visit
Admission: RE | Admit: 2016-01-16 | Discharge: 2016-01-16 | Disposition: A | Discharge status: Home / Self Care | Payer: Medicare Other | Source: Ambulatory Visit | Attending: Family Medicine | Admitting: Family Medicine

## 2016-01-16 ENCOUNTER — Other Ambulatory Visit: Payer: Self-pay | Admitting: Family Medicine

## 2016-01-16 DIAGNOSIS — M85852 Other specified disorders of bone density and structure, left thigh: Secondary | ICD-10-CM | POA: Diagnosis not present

## 2016-01-16 DIAGNOSIS — Z1231 Encounter for screening mammogram for malignant neoplasm of breast: Secondary | ICD-10-CM | POA: Insufficient documentation

## 2016-01-16 DIAGNOSIS — Z1239 Encounter for other screening for malignant neoplasm of breast: Secondary | ICD-10-CM

## 2016-01-16 DIAGNOSIS — M8588 Other specified disorders of bone density and structure, other site: Secondary | ICD-10-CM | POA: Diagnosis not present

## 2016-01-16 DIAGNOSIS — E2839 Other primary ovarian failure: Secondary | ICD-10-CM | POA: Insufficient documentation

## 2016-01-25 DIAGNOSIS — H2640 Unspecified secondary cataract: Secondary | ICD-10-CM | POA: Diagnosis not present

## 2016-03-12 ENCOUNTER — Other Ambulatory Visit: Payer: Self-pay | Admitting: Family Medicine

## 2016-04-25 DIAGNOSIS — H43813 Vitreous degeneration, bilateral: Secondary | ICD-10-CM | POA: Diagnosis not present

## 2016-04-25 DIAGNOSIS — H35342 Macular cyst, hole, or pseudohole, left eye: Secondary | ICD-10-CM | POA: Diagnosis not present

## 2016-04-27 DIAGNOSIS — H35342 Macular cyst, hole, or pseudohole, left eye: Secondary | ICD-10-CM | POA: Diagnosis not present

## 2016-05-09 DIAGNOSIS — H40053 Ocular hypertension, bilateral: Secondary | ICD-10-CM | POA: Diagnosis not present

## 2016-05-22 ENCOUNTER — Other Ambulatory Visit: Payer: Self-pay | Admitting: Family Medicine

## 2016-05-22 ENCOUNTER — Encounter: Payer: Self-pay | Admitting: Family Medicine

## 2016-05-22 ENCOUNTER — Ambulatory Visit (INDEPENDENT_AMBULATORY_CARE_PROVIDER_SITE_OTHER): Payer: Medicare Other | Admitting: Family Medicine

## 2016-05-22 VITALS — BP 110/62 | HR 55 | Temp 97.9°F | Resp 16 | Ht 64.0 in | Wt 139.3 lb

## 2016-05-22 DIAGNOSIS — Z79899 Other long term (current) drug therapy: Secondary | ICD-10-CM

## 2016-05-22 DIAGNOSIS — R001 Bradycardia, unspecified: Secondary | ICD-10-CM | POA: Diagnosis not present

## 2016-05-22 DIAGNOSIS — I10 Essential (primary) hypertension: Secondary | ICD-10-CM

## 2016-05-22 DIAGNOSIS — R8290 Unspecified abnormal findings in urine: Secondary | ICD-10-CM

## 2016-05-22 DIAGNOSIS — R634 Abnormal weight loss: Secondary | ICD-10-CM | POA: Diagnosis not present

## 2016-05-22 DIAGNOSIS — R7301 Impaired fasting glucose: Secondary | ICD-10-CM

## 2016-05-22 DIAGNOSIS — R1310 Dysphagia, unspecified: Secondary | ICD-10-CM | POA: Diagnosis not present

## 2016-05-22 DIAGNOSIS — E785 Hyperlipidemia, unspecified: Secondary | ICD-10-CM | POA: Diagnosis not present

## 2016-05-22 DIAGNOSIS — E559 Vitamin D deficiency, unspecified: Secondary | ICD-10-CM | POA: Diagnosis not present

## 2016-05-22 DIAGNOSIS — M1711 Unilateral primary osteoarthritis, right knee: Secondary | ICD-10-CM | POA: Diagnosis not present

## 2016-05-22 DIAGNOSIS — M8580 Other specified disorders of bone density and structure, unspecified site: Secondary | ICD-10-CM | POA: Diagnosis not present

## 2016-05-22 DIAGNOSIS — R1319 Other dysphagia: Secondary | ICD-10-CM

## 2016-05-22 LAB — CBC WITH DIFFERENTIAL/PLATELET
BASOS ABS: 0 {cells}/uL (ref 0–200)
Basophils Relative: 0 %
EOS PCT: 3 %
Eosinophils Absolute: 225 cells/uL (ref 15–500)
HCT: 41.3 % (ref 35.0–45.0)
HEMOGLOBIN: 13.4 g/dL (ref 11.7–15.5)
LYMPHS ABS: 1950 {cells}/uL (ref 850–3900)
Lymphocytes Relative: 26 %
MCH: 29.6 pg (ref 27.0–33.0)
MCHC: 32.4 g/dL (ref 32.0–36.0)
MCV: 91.2 fL (ref 80.0–100.0)
MONOS PCT: 7 %
MPV: 9.7 fL (ref 7.5–12.5)
Monocytes Absolute: 525 cells/uL (ref 200–950)
NEUTROS PCT: 64 %
Neutro Abs: 4800 cells/uL (ref 1500–7800)
PLATELETS: 228 10*3/uL (ref 140–400)
RBC: 4.53 MIL/uL (ref 3.80–5.10)
RDW: 14.8 % (ref 11.0–15.0)
WBC: 7.5 10*3/uL (ref 3.8–10.8)

## 2016-05-22 LAB — COMPLETE METABOLIC PANEL WITH GFR
ALBUMIN: 4.5 g/dL (ref 3.6–5.1)
ALK PHOS: 64 U/L (ref 33–130)
ALT: 11 U/L (ref 6–29)
AST: 17 U/L (ref 10–35)
BILIRUBIN TOTAL: 0.7 mg/dL (ref 0.2–1.2)
BUN: 11 mg/dL (ref 7–25)
CALCIUM: 10 mg/dL (ref 8.6–10.4)
CO2: 28 mmol/L (ref 20–31)
Chloride: 105 mmol/L (ref 98–110)
Creat: 0.95 mg/dL — ABNORMAL HIGH (ref 0.60–0.93)
GFR, EST NON AFRICAN AMERICAN: 60 mL/min (ref >=60)
GFR, Est African American: 69 mL/min (ref >=60)
Glucose, Bld: 104 mg/dL — ABNORMAL HIGH (ref 65–99)
POTASSIUM: 5.2 mmol/L (ref 3.5–5.3)
Sodium: 140 mmol/L (ref 135–146)
TOTAL PROTEIN: 7 g/dL (ref 6.1–8.1)

## 2016-05-22 LAB — LIPID PANEL
CHOLESTEROL: 187 mg/dL (ref 125–200)
HDL: 67 mg/dL (ref >=46)
LDL Cholesterol: 101 mg/dL (ref <130)
TRIGLYCERIDES: 93 mg/dL (ref <150)
Total CHOL/HDL Ratio: 2.8 Ratio (ref <=5.0)
VLDL: 19 mg/dL (ref <30)

## 2016-05-22 LAB — TSH: TSH: 2.21 mIU/L

## 2016-05-22 LAB — VITAMIN B12: VITAMIN B 12: 1503 pg/mL — AB (ref 200–1100)

## 2016-05-22 LAB — HEMOGLOBIN A1C
HEMOGLOBIN A1C: 5.8 % — AB (ref <5.7)
MEAN PLASMA GLUCOSE: 120 mg/dL

## 2016-05-22 NOTE — Progress Notes (Signed)
Name: Yvonne Andrews   MRN: [id number]    DOB: 1941/01/19   Date:05/22/2016       Progress Note  Subjective  Chief Complaint  Chief Complaint  Patient presents with  . Hypertension    pt here for 6 month follow up  . Hyperlipidemia    HPI  HTN: Patient has been compliant with medication and denies side effects, no chest pain, no palpitation. BP at home is running 114-148/67-76, usually 130's  Hyperlipidemia: she is back on  Crestor , in the past had elevated LFT's with Lipitor, we tried Pravastatin but did not work. No myalgias.  Prediabetes/fasting hyperglycemia:  hgbA1C was 6.1 this past January.  She denies polydipsia, polyuria or polyphagia.She has been walking more and also avoiding bread and has lost 5 lbs since last visit.   Weight loss: only dietary change is cutting down on bread, still eating every day, no fatigue.  Dysphagia: it happened once this am, when she drank water, she did not eat or take medications, it resolves as soon as she finished swallowing, advised to try some Tums or Maalox for now, call back if not better  OA right knee: she has intermittent pain on right medial knee , and over the right patella when going down stairs. No effusion, no redness. Pain level at this time is zero. She take Tylenol prn very seldom.  Patient Active Problem List   Diagnosis Date Noted  . Intraocular pressure increase 07/04/2015  . Bilateral tinnitus 07/04/2015  . Benign essential HTN 12/31/2014  . Carpal tunnel syndrome 12/31/2014  . Dyslipidemia 12/31/2014  . Ovarian failure 12/31/2014  . Osteopenia 12/31/2014  . Allergic rhinitis 12/31/2014  . Arthritis of knee, degenerative 12/31/2014  . Acquired trigger finger 12/31/2014  . Cataracts, bilateral 12/31/2014  . Fasting hyperglycemia 12/31/2014  . LBP (low back pain) 05/27/2007    Past Surgical History:  Procedure Laterality Date  . TRIGGER FINGER RELEASE    . TUBAL LIGATION      Family History  Problem  Relation Age of Onset  . Heart disease Mother   . CAD Father   . Heart disease Father   . Cancer Brother   . Breast cancer Neg Hx     Social History   Social History  . Marital status: Married    Spouse name: N/A  . Number of children: N/A  . Years of education: N/A   Occupational History  . Not on file.   Social History Main Topics  . Smoking status: Never Smoker  . Smokeless tobacco: Never Used  . Alcohol use No  . Drug use: No  . Sexual activity: Not Currently   Other Topics Concern  . Not on file   Social History Narrative  . No narrative on file     Current Outpatient Prescriptions:  .  Cholecalciferol (VITAMIN D) 2000 UNITS CAPS, Take 1 capsule (2,000 Units total) by mouth daily., Disp: 30 capsule, Rfl: 0 .  COMBIGAN 0.2-0.5 % ophthalmic solution, TAKE 1 DROP(S) IN BOTH EYES 2 TIMES A DAY -- PLEASE DISPENSE LARGEST QUANTITY ALLOWED BY INSURANCE, Disp: , Rfl: 3 .  losartan (COZAAR) 50 MG tablet, Take 1 tablet (50 mg total) by mouth daily., Disp: 90 tablet, Rfl: 3 .  MULTIPLE VITAMINS-MINERALS ER PO, Take 1 tablet by mouth daily., Disp: , Rfl:  .  rosuvastatin (CRESTOR) 10 MG tablet, TAKE 1 TABLET (10 MG TOTAL) BY MOUTH DAILY., Disp: 30 tablet, Rfl: 3 .  aspirin EC 81  MG tablet, Take 1 tablet (81 mg total) by mouth daily., Disp: 30 tablet, Rfl: 0 .  Calcium Carbonate 500 MG CHEW, Chew 1 tablet by mouth daily., Disp: , Rfl:  .  latanoprost (XALATAN) 0.005 % ophthalmic solution, TAKE 1 DROP IN RIGHT EYE ONCE IN THE EVENING, Disp: , Rfl: 5  Allergies  Allergen Reactions  . Lipitor [Atorvastatin] Other (See Comments)    Elevation of LFT's     ROS  Constitutional: Negative for fever , positive for weight change.  Respiratory: Positive  for mild cough, no  shortness of breath.   Cardiovascular: Negative for chest pain or palpitations.  Gastrointestinal: Negative for abdominal pain, no bowel changes.  Musculoskeletal: Negative for gait problem or joint swelling.   Skin: Negative for rash.  Neurological: Negative for dizziness or headache.  No other specific complaints in a complete review of systems (except as listed in HPI above).  Objective  Vitals:   05/22/16 0903  BP: 110/62  Pulse: (!) 55  Resp: 16  Temp: 97.9 F (36.6 C)  TempSrc: Oral  SpO2: 98%  Weight: 139 lb 5 oz (63.2 kg)  Height: 5\' 4"  (1.626 m)    Body mass index is 23.91 kg/m.  Physical Exam   Constitutional: Patient appears well-developed and well-nourished. No distress.  HEENT: head atraumatic, normocephalic, pupils equal and reactive to light,neck supple, throat within normal limits Cardiovascular: Normal rate, regular rhythm and normal heart sounds.  No murmur heard. No BLE edema. Pulmonary/Chest: Effort normal and breath sounds normal. No respiratory distress. Abdominal: Soft.  There is no tenderness. Negative CVA Psychiatric: Patient has a normal mood and affect. behavior is normal. Judgment and thought content normal.  PHQ2/9: [facility] 2/9 05/22/2016 11/17/2015 09/19/2015 07/04/2015 12/31/2014  Decreased Interest 0 0 0 0 0  Down, Depressed, Hopeless 0 0 0 0 0  PHQ - 2 Score 0 0 0 0 0    Fall Risk: Fall Risk  05/22/2016 11/17/2015 09/19/2015 07/04/2015 12/31/2014  Falls in the past year? No No No No No     Functional Status Survey: Is the patient deaf or have difficulty hearing?: No Does the patient have difficulty seeing, even when wearing glasses/contacts?: No Does the patient have difficulty concentrating, remembering, or making decisions?: No Does the patient have difficulty walking or climbing stairs?: No Does the patient have difficulty dressing or bathing?: No Does the patient have difficulty doing errands alone such as visiting a doctor's office or shopping?: No   Assessment & Plan  1. Benign essential HTN  At goal  - COMPLETE METABOLIC PANEL WITH GFR - EKG [id number]-Lead  2. Dyslipidemia  - Lipid panel - COMPLETE METABOLIC PANEL  WITH GFR - EKG 12-Lead  3. Long-term use of high-risk medication  - COMPLETE METABOLIC PANEL WITH GFR  4. Fasting hyperglycemia  - Hemoglobin A1c  5. Weight loss  Lost 5 lbs in the past 6 months, but she states she stopped taking carbs - CBC with Differential/Platelet - TSH - Vitamin B12 - VITAMIN D 25 Hydroxy (Vit-D Deficiency, Fractures)  6. Cloudy urine  - CULTURE, URINE COMPREHENSIVE  7. Osteopenia, unspecified location  - VITAMIN D 25 Hydroxy (Vit-D Deficiency, Fractures)  8. Esophageal dysphagia  Just happened once this am, after she drank water, advised otc tums or Maalox and if no improvement call back for upper GI   9. Primary osteoarthritis of right knee  Continue Tylenol prn    10. Bradycardia  - Ambulatory referral to Cardiology

## 2016-05-23 LAB — VITAMIN D 25 HYDROXY (VIT D DEFICIENCY, FRACTURES): VIT D 25 HYDROXY: 79 ng/mL (ref 30–100)

## 2016-05-24 LAB — CULTURE, URINE COMPREHENSIVE: Organism ID, Bacteria: NO GROWTH

## 2016-05-29 ENCOUNTER — Ambulatory Visit (INDEPENDENT_AMBULATORY_CARE_PROVIDER_SITE_OTHER): Payer: Medicare Other | Admitting: Internal Medicine

## 2016-05-29 ENCOUNTER — Encounter: Payer: Self-pay | Admitting: Internal Medicine

## 2016-05-29 VITALS — BP 148/70 | Ht 65.0 in | Wt 141.2 lb

## 2016-05-29 DIAGNOSIS — I10 Essential (primary) hypertension: Secondary | ICD-10-CM

## 2016-05-29 DIAGNOSIS — E559 Vitamin D deficiency, unspecified: Secondary | ICD-10-CM | POA: Insufficient documentation

## 2016-05-29 DIAGNOSIS — R001 Bradycardia, unspecified: Secondary | ICD-10-CM

## 2016-05-29 NOTE — Progress Notes (Signed)
New Outpatient Visit Date: 05/29/2016  Referring Provider: Alba CoryKrichna Sowles, MD [address] [place], [place] [id number]  Chief Complaint: Bradycardia  HPI:  [name] is a 75 y.o. year-old female with history of hyperlipidemia, hypertension, and impaired glucose tolerance, who has been referred by Dr. Carlynn PurlSowles for evaluation of bradycardia. The patient was recently evaluated by her primary care provider and was noted to have sinus bradycardia with a rate of 46 bpm by EKG. [name] denies having any symptoms including lightheadedness, fatigue, chest pain, or shortness of breath. She walks regularly and performs outdoor activities such as raking without any difficulties. She believes that her arms get a little more tired than they used to but overall feels about the same as she has for several years. She denies orthopnea, PND, and leg edema. She has never passed out. The patient denies a history of prior cardiovascular disease or evaluation. Her antihypertensive regimen was recently deescalated by her PCP. Otherwise, no medication changes have been made recently. The patient drinks two glasses of tea and one cup of decaffeinated coffee per day. She notes 1 or 2 episodes of brief palpitations a few months ago, which have not recurred. They only lasted for a few seconds with a sensation of fluttering in her chest. There were no associated symptoms including chest pain and lightheadedness.  --------------------------------------------------------------------------------------------------  Cardiovascular History & Procedures: Cardiovascular Problems:  Sinus bradycardia  Risk Factors:  Hypertension and age greater than 6665  Cath/PCI:  None  CV Surgery:  None  EP Procedures and Devices:  None  Non-Invasive Evaluation(s):  None  Recent CV Pertinent Labs: Lab Results  Component Value Date   CHOL 187 05/22/2016   CHOL 230 (H) 11/11/2015   HDL 67 05/22/2016   HDL 77 11/11/2015     LDLCALC 101 05/22/2016   LDLCALC 135 (H) 11/11/2015   TRIG 93 05/22/2016   CHOLHDL 2.8 05/22/2016   K 5.2 05/22/2016   BUN 11 05/22/2016   CREATININE 0.95 (H) 05/22/2016    --------------------------------------------------------------------------------------------------  Past Medical History:  Diagnosis Date  . Allergy   . Bilateral tinnitus   . Cataracts, bilateral   . Hyperlipidemia   . Hypertension   . Trigger finger of right hand     Past Surgical History:  Procedure Laterality Date  . TRIGGER FINGER RELEASE    . TUBAL LIGATION      Outpatient Encounter Prescriptions as of 05/29/2016  Medication Sig  . aspirin EC 81 MG tablet Take 1 tablet (81 mg total) by mouth daily.  . Calcium Carbonate 500 MG CHEW Chew 1 tablet by mouth daily.  . Cholecalciferol (VITAMIN D) 2000 UNITS CAPS Take 1 capsule (2,000 Units total) by mouth daily.  . COMBIGAN 0.2-0.5 % ophthalmic solution TAKE 1 DROP(S) IN BOTH EYES 2 TIMES A DAY -- PLEASE DISPENSE LARGEST QUANTITY ALLOWED BY INSURANCE  . losartan (COZAAR) 50 MG tablet Take 1 tablet (50 mg total) by mouth daily.  . MULTIPLE VITAMINS-MINERALS ER PO Take 1 tablet by mouth daily.  . Nutritional Supplements (ESTROVEN PM PO) Take by mouth daily.  . rosuvastatin (CRESTOR) 10 MG tablet TAKE 1 TABLET (10 MG TOTAL) BY MOUTH DAILY.  . [DISCONTINUED] latanoprost (XALATAN) 0.005 % ophthalmic solution TAKE 1 DROP IN RIGHT EYE ONCE IN THE EVENING   No facility-administered encounter medications on file as of 05/29/2016.     Allergies: Lipitor [atorvastatin]  Social History   Social History  . Marital status: Married    Spouse  name: N/A  . Number of children: N/A  . Years of education: N/A   Occupational History  . Not on file.   Social History Main Topics  . Smoking status: Never Smoker  . Smokeless tobacco: Never Used  . Alcohol use No  . Drug use: No  . Sexual activity: Not Currently   Other Topics Concern  . Not on file    Social History Narrative  . No narrative on file    Family History  Problem Relation Age of Onset  . Heart disease Mother   . CAD Father   . Heart attack Father 72  . Cancer Brother   . Breast cancer Neg Hx     Review of Systems: A 12-system review of systems was performed and was negative except as noted in the HPI.  --------------------------------------------------------------------------------------------------  Physical Exam: BP (!) 148/70   Ht 5\' 5"  (1.651 m)   Wt 141 lb 4 oz (64.1 kg)   BMI 23.51 kg/m   General:  Well-developed, well-nourished woman seated comfortably in the exam room. HEENT: No conjunctival pallor or scleral icterus.  Moist mucous membranes.  OP clear. Neck: Supple without lymphadenopathy, thyromegaly, JVD, or HJR.  No carotid bruit. Lungs: Normal work of breathing.  Clear to auscultation bilaterally without wheezes or crackles. Heart: Regular rate and rhythm without murmurs, rubs, or gallops.  Non-displaced PMI. Abd: Bowel sounds present.  Soft, NT/ND without hepatosplenomegaly Ext: No lower extremity edema.  Radial, PT, and DP pulses are 2+ bilaterally Skin: warm and dry without rash Neuro: CNIII-XII intact.  Strength and fine-touch sensation intact in upper and lower extremities bilaterally. Psych: Normal mood and affect.  EKG:  Sinus bradycardia (heart rate 54 bpm). Low voltage. Otherwise, no significant abnormalities.  The patient was ambulated through the office, which she did without any symptoms. Her heart rate rose to 74 bpm.  Lab Results  Component Value Date   WBC 7.5 05/22/2016   HGB 13.4 05/22/2016   HCT 41.3 05/22/2016   MCV 91.2 05/22/2016   PLT 228 05/22/2016    Lab Results  Component Value Date   NA 140 05/22/2016   K 5.2 05/22/2016   CL 105 05/22/2016   CO2 28 05/22/2016   BUN 11 05/22/2016   CREATININE 0.95 (H) 05/22/2016   GLUCOSE 104 (H) 05/22/2016   ALT 11 05/22/2016    Lab Results  Component Value Date    CHOL 187 05/22/2016   HDL 67 05/22/2016   LDLCALC 101 05/22/2016   TRIG 93 05/22/2016   CHOLHDL 2.8 05/22/2016   Lab Results  Component Value Date   TSH 2.21 05/22/2016   --------------------------------------------------------------------------------------------------  ASSESSMENT AND PLAN: Sinus bradycardia Patient is asymptomatic and demonstrates an appropriate chronotropic response to ambulation in the office today. Her EKG shows sinus rhythm without any PR prolongation or QRS widening. Low voltage is noted but nonspecific. Recent labs are unrevealing, including a normal TSH. The patient may have a low resting heart rate at baseline, which could be exacerbated by timolol eyedrops that she is using for glaucoma. This medication can be absorbed and demonstrate systemic effects. As such, I have encouraged the patient to speak with her PCP and ophthalmologist about alternative treatment options for her glaucoma that would allow discontinuation of timolol. No further workup is needed at this time. We will have the patient return in about 6 months to ensure that she has not developed any new conduction disease that would warrant further investigation or therapy.  Hypertension  Blood pressure is mildly elevated today.  Given recent change in blood pressure medication, I have encouraged the patient to keep monitoring her blood pressure at home and to speak with her PCP about further adjustments of her blood pressure is consistently above 140/90.  Follow-up: Return to clinic in 6 months  Yvonne Kendallhristopher Crespin Forstrom, MD 05/29/2016 8:23 PM

## 2016-05-29 NOTE — Patient Instructions (Addendum)
Medication Instructions:  Your physician recommends that you continue on your current medications as directed. Please refer to the Current Medication list given to you today.   Labwork: none  Testing/Procedures: none  Follow-Up: Your physician wants you to follow-up in: six months with Dr. Okey DupreEnd.  You will receive a reminder letter in the mail two months in advance. If you don't receive a letter, please call our office to schedule the follow-up appointment.   Any Other Special Instructions Will Be Listed Below (If Applicable). Please speak with your opthomologist regarding changing combigan eye drops     If you need a refill on your cardiac medications before your next appointment, please call your pharmacy.

## 2016-06-01 ENCOUNTER — Telehealth: Payer: Self-pay | Admitting: Family Medicine

## 2016-06-01 NOTE — Telephone Encounter (Signed)
Patient was told that her b-12 was elevated and would like to know what should she be doing for it.

## 2016-06-04 NOTE — Telephone Encounter (Signed)
She can stop taking supplementation if she is on vitamins, otherwise there is nothing to be done.

## 2016-06-18 DIAGNOSIS — H40053 Ocular hypertension, bilateral: Secondary | ICD-10-CM | POA: Diagnosis not present

## 2016-07-11 ENCOUNTER — Other Ambulatory Visit: Payer: Self-pay | Admitting: Family Medicine

## 2016-09-12 ENCOUNTER — Other Ambulatory Visit: Payer: Self-pay

## 2016-09-12 MED ORDER — ROSUVASTATIN CALCIUM 10 MG PO TABS: 10.0000 mg | ORAL_TABLET | Freq: Every day | ORAL | 0 refills | Status: DC

## 2016-09-12 NOTE — Telephone Encounter (Signed)
Patient requesting refill of [facility] to [facility].

## 2016-09-20 ENCOUNTER — Ambulatory Visit (INDEPENDENT_AMBULATORY_CARE_PROVIDER_SITE_OTHER): Payer: Medicare PPO | Admitting: Family Medicine

## 2016-09-20 ENCOUNTER — Encounter: Payer: Self-pay | Admitting: Family Medicine

## 2016-09-20 VITALS — BP 126/68 | HR 67 | Temp 98.0°F | Resp 16 | Ht 62.5 in | Wt 141.0 lb

## 2016-09-20 DIAGNOSIS — Z1231 Encounter for screening mammogram for malignant neoplasm of breast: Secondary | ICD-10-CM | POA: Diagnosis not present

## 2016-09-20 DIAGNOSIS — Z23 Encounter for immunization: Secondary | ICD-10-CM | POA: Diagnosis not present

## 2016-09-20 DIAGNOSIS — I10 Essential (primary) hypertension: Secondary | ICD-10-CM

## 2016-09-20 DIAGNOSIS — Z0000 Encounter for general adult medical examination without abnormal findings: Secondary | ICD-10-CM

## 2016-09-20 DIAGNOSIS — R7301 Impaired fasting glucose: Secondary | ICD-10-CM

## 2016-09-20 DIAGNOSIS — Z1239 Encounter for other screening for malignant neoplasm of breast: Secondary | ICD-10-CM

## 2016-09-20 DIAGNOSIS — E785 Hyperlipidemia, unspecified: Secondary | ICD-10-CM

## 2016-09-20 MED ORDER — LOSARTAN POTASSIUM 50 MG PO TABS: 50.0000 mg | ORAL_TABLET | Freq: Every day | ORAL | 3 refills | Status: DC

## 2016-09-20 NOTE — Progress Notes (Signed)
Name: Yvonne Andrews   MRN: [id number]    DOB: June 01, 1941   Date:09/20/2016       Progress Note  Subjective  Chief Complaint  Chief Complaint  Patient presents with  . Annual Exam    HPI   Functional ability/safety issues: No Issues  Hearing issues: Addressed  Activities of daily living: Discussed Home safety issues: No Issues  End Of Life Planning: Offered verbal information regarding advanced directives, healthcare power of attorney.  Preventative care, Health maintenance, Preventative health measures discussed.  Preventative screenings discussed today: lab work, colonoscopy, PAP - discussed USPTF and we will not longer check it, mammogram, DEXA.  Low Dose CT Chest recommended if Age 44-80 years, 30 pack-year currently smoking OR have quit w/in 15years.   Lifestyle risk factor issued reviewed: Diet, exercise, weight management, advised patient smoking is not healthy, nutrition/diet.  Preventative health measures discussed (5-10 year plan).  Reviewed and recommended vaccinations: - Pneumovax  - Prevnar  - Annual Influenza - Zostavax - Tdap   Depression screening: Done Fall risk screening: Done Discuss ADLs/IADLs: Done  Current medical providers: See HPI  Other health risk factors identified this visit: No other issues Cognitive impairment issues: None identified  All above discussed with patient. Appropriate education, counseling and referral will be made based upon the above.   HTN: Patient has been compliant with medication and denies side effects, no chest pain, no palpitation.   Hyperlipidemia: she is back on  Crestor , in the past had elevated LFT's with Lipitor, we tried Pravastatin but did not work. No myalgias. Last labs was at goal and LFT"s normal   Prediabetes/fasting hyperglycemia:  hgbA1C was 6.1 this past January, but down to 5.8% back in October.  She denies polydipsia, polyuria or polyphagia.Weight is stable  OA right knee: she has  intermittent pain on right medial knee , and over the right patella when going down stairs. No effusion, no redness. Pain level at this time is zero. She take Tylenol prn very seldom.   Patient Active Problem List   Diagnosis Date Noted  . Vitamin D deficiency 05/29/2016  . Intraocular pressure increase 07/04/2015  . Bilateral tinnitus 07/04/2015  . Benign essential HTN 12/31/2014  . Carpal tunnel syndrome 12/31/2014  . Dyslipidemia 12/31/2014  . Ovarian failure 12/31/2014  . Osteopenia 12/31/2014  . Allergic rhinitis 12/31/2014  . Arthritis of knee, degenerative 12/31/2014  . Acquired trigger finger 12/31/2014  . Cataracts, bilateral 12/31/2014  . Fasting hyperglycemia 12/31/2014  . LBP (low back pain) 05/27/2007    Past Surgical History:  Procedure Laterality Date  . TRIGGER FINGER RELEASE    . TUBAL LIGATION      Family History  Problem Relation Age of Onset  . Heart disease Mother   . CAD Father   . Heart attack Father [id number]  . Cancer Brother   . Breast cancer Neg Hx     Social History   Social History  . Marital status: Married    Spouse name: N/A  . Number of children: N/A  . Years of education: N/A   Occupational History  . Not on file.   Social History Main Topics  . Smoking status: Never Smoker  . Smokeless tobacco: Never Used  . Alcohol use No  . Drug use: No  . Sexual activity: Not Currently   Other Topics Concern  . Not on file   Social History Narrative  . No narrative on file     Current Outpatient Prescriptions:  .  aspirin EC 81 MG tablet, Take 1 tablet (81 mg total) by mouth daily., Disp: 30 tablet, Rfl: 0 .  brimonidine (ALPHAGAN) 0.2 % ophthalmic solution, , Disp: , Rfl:  .  Calcium Carbonate 500 MG CHEW, Chew 1 tablet by mouth daily., Disp: , Rfl:  .  Cholecalciferol (VITAMIN D) 2000 UNITS CAPS, Take 1 capsule (2,000 Units total) by mouth daily., Disp: 30 capsule, Rfl: 0 .  COMBIGAN 0.2-0.5 % ophthalmic solution, TAKE 1 DROP(S) IN  BOTH EYES 2 TIMES A DAY -- PLEASE DISPENSE LARGEST QUANTITY ALLOWED BY INSURANCE, Disp: , Rfl: 3 .  losartan (COZAAR) 50 MG tablet, Take 1 tablet (50 mg total) by mouth daily., Disp: 90 tablet, Rfl: 3 .  MULTIPLE VITAMINS-MINERALS ER PO, Take 1 tablet by mouth daily., Disp: , Rfl:  .  Nutritional Supplements (ESTROVEN PM PO), Take by mouth daily., Disp: , Rfl:  .  rosuvastatin (CRESTOR) 10 MG tablet, Take 1 tablet (10 mg total) by mouth daily., Disp: 90 tablet, Rfl: 0  Allergies  Allergen Reactions  . Lipitor [Atorvastatin] Other (See Comments)    Elevation of LFT's     ROS  Constitutional: Negative for fever or weight change.  Respiratory: Negative for cough and shortness of breath.   Cardiovascular: Negative for chest pain or palpitations.  Gastrointestinal: Negative for abdominal pain, no bowel changes.  Musculoskeletal: Negative for gait problem or joint swelling.  Skin: Negative for rash.  Neurological: Negative for dizziness or headache.  No other specific complaints in a complete review of systems (except as listed in HPI above).  Objective  Vitals:   09/20/16 0825  BP: 126/68  Pulse: 67  Resp: 16  Temp: 98 F (36.7 C)  TempSrc: Oral  SpO2: 98%  Weight: 141 lb (64 kg)  Height: 5' 2.5" (1.588 m)    Body mass index is 25.38 kg/m.  Physical Exam  Constitutional: Patient appears well-developed and well-nourished. No distress.  HENT: Head: Normocephalic and atraumatic. Ears: B TMs ok, no erythema or effusion; Nose: Nose normal. Mouth/Throat: Oropharynx is clear and moist. No oropharyngeal exudate.  Eyes: Conjunctivae and EOM are normal. Pupils are equal, round, and reactive to light. No scleral icterus.  Neck: Normal range of motion. Neck supple. No JVD present. No thyromegaly present.  Cardiovascular: Normal rate, regular rhythm and normal heart sounds.  No murmur heard. No BLE edema. Pulmonary/Chest: Effort normal and breath sounds normal. No respiratory  distress. Abdominal: Soft. Bowel sounds are normal, no distension. There is no tenderness. no masses Breast: no lumps or masses, no nipple discharge or rashes FEMALE GENITALIA:  Not done RECTAL: not done Musculoskeletal: Normal range of motion, no joint effusions. No gross deformities Neurological: he is alert and oriented to person, place, and time. No cranial nerve deficit. Coordination, balance, strength, speech and gait are normal.  Skin: Skin is warm and dry. No rash noted. No erythema.  Psychiatric: Patient has a normal mood and affect. behavior is normal. Judgment and thought content normal.  PHQ2/9: Depression screen [facility] 2/9 09/20/2016 05/22/2016 11/17/2015 09/19/2015 07/04/2015  Decreased Interest 0 0 0 0 0  Down, Depressed, Hopeless 0 0 0 0 0  PHQ - 2 Score 0 0 0 0 0     Fall Risk: Fall Risk  09/20/2016 05/22/2016 11/17/2015 09/19/2015 07/04/2015  Falls in the past year? No No No No No   Current Exercise Habits: Home exercise routine, Type of exercise: walking, Time (Minutes): 15, Frequency (Times/Week): 4, Weekly Exercise (Minutes/Week): 60, Intensity: Mild  Functional Status Survey: Is the patient deaf or have difficulty hearing?: No Does the patient have difficulty seeing, even when wearing glasses/contacts?: Yes (from left eye, may need surgery seeing Dr. Alinda Money) Does the patient have difficulty concentrating, remembering, or making decisions?: No Does the patient have difficulty walking or climbing stairs?: No Does the patient have difficulty dressing or bathing?: No Does the patient have difficulty doing errands alone such as visiting a doctor's office or shopping?: No    Assessment & Plan  1. Medicare annual wellness visit, subsequent  Discussed importance of 150 minutes of physical activity weekly, eat two servings of fish weekly, eat one serving of tree nuts ( cashews, pistachios, pecans, almonds.Marland Kitchen) every other day, eat 6 servings of fruit/vegetables daily  and drink plenty of water and avoid sweet beverages.   2. Breast cancer screening   Last one 2017 she will have it done in 2019  3. Fasting hyperglycemia  Continue life style modification   4. Benign essential HTN  - losartan (COZAAR) 50 MG tablet; Take 1 tablet (50 mg total) by mouth daily.  Dispense: 90 tablet; Refill: 3  5. Dyslipidemia  Continue medication

## 2016-09-21 ENCOUNTER — Encounter: Payer: Medicare Other | Admitting: Family Medicine

## 2016-10-05 ENCOUNTER — Other Ambulatory Visit: Payer: Self-pay

## 2016-10-05 DIAGNOSIS — I10 Essential (primary) hypertension: Secondary | ICD-10-CM

## 2016-10-05 NOTE — Telephone Encounter (Signed)
Patient is asking we sent in a 90 day supply instead of a 30 day supply to [facility].

## 2016-10-07 MED ORDER — LOSARTAN POTASSIUM 50 MG PO TABS: 50.0000 mg | ORAL_TABLET | Freq: Every day | ORAL | 1 refills | Status: DC

## 2016-11-14 ENCOUNTER — Other Ambulatory Visit: Payer: Self-pay | Admitting: Family Medicine

## 2016-11-16 ENCOUNTER — Ambulatory Visit: Payer: Medicare Other | Admitting: Family Medicine

## 2016-11-28 ENCOUNTER — Ambulatory Visit (INDEPENDENT_AMBULATORY_CARE_PROVIDER_SITE_OTHER): Payer: Medicare PPO | Admitting: Internal Medicine

## 2016-11-28 VITALS — BP 124/68 | HR 66 | Ht 65.0 in | Wt 140.0 lb

## 2016-11-28 DIAGNOSIS — I10 Essential (primary) hypertension: Secondary | ICD-10-CM

## 2016-11-28 DIAGNOSIS — K30 Functional dyspepsia: Secondary | ICD-10-CM

## 2016-11-28 DIAGNOSIS — R001 Bradycardia, unspecified: Secondary | ICD-10-CM

## 2016-11-28 NOTE — Progress Notes (Signed)
Follow-up Outpatient Visit Date: 11/28/2016  Primary Care Provider: Steele Sizer, [address] [place] [address]  Chief Complaint: Follow-up bradycardia  HPI:  Yvonne Andrews is a 76 y.o. year-old female with history of hypertension, hyperlipidemia, and impaired glucose tolerance, who presents for follow-up of asymptomatic sinus bradycardia. I met her in 05/2016, at which time she was asymptomatic but had been noted to have a heart rate in the 50s. I asked her to speak with her PCP and ophthalmologist about stopping timolol eyedrops. She was switched bromidine for management of her glaucoma. Today, [name] reports feeling well. She had one episode of "indigestion" last week at which time she had an uncomfortable sensation radiating from her epigastrium to her upper chest. This lasted about 10 minutes before resolving spontaneously. It occurred at rest, shortly after having eaten. She has not had any further chest pain or shortness of breath, including with activity. She also denies palpitations, lightheadedness, orthopnea, and PND. Her blood pressure has generally been well controlled at home with readings in the 120s/60s.  --------------------------------------------------------------------------------------------------  Cardiovascular History & Procedures: Cardiovascular Problems:  Sinus bradycardia  Risk Factors:  Hypertension, hyperlipidemia, and age greater than 71  Cath/PCI:  None  CV Surgery:  None  EP Procedures and Devices:  None  Non-Invasive Evaluation(s):  None  Recent CV Pertinent Labs: Lab Results  Component Value Date   CHOL 187 05/22/2016   CHOL 230 (H) 11/11/2015   HDL 67 05/22/2016   HDL 77 11/11/2015   LDLCALC 101 05/22/2016   LDLCALC 135 (H) 11/11/2015   TRIG 93 05/22/2016   CHOLHDL 2.8 05/22/2016   K 5.2 05/22/2016   BUN 11 05/22/2016   CREATININE 0.95 (H) 05/22/2016    Past medical and surgical history were reviewed  and updated in EPIC.  Outpatient Encounter Prescriptions as of 11/28/2016  Medication Sig  . aspirin EC 81 MG tablet Take 1 tablet (81 mg total) by mouth daily.  . brimonidine (ALPHAGAN) 0.2 % ophthalmic solution   . Calcium Carbonate 500 MG CHEW Chew 1 tablet by mouth daily.  . Cholecalciferol (VITAMIN D) 2000 UNITS CAPS Take 1 capsule (2,000 Units total) by mouth daily.  Marland Kitchen losartan (COZAAR) 50 MG tablet Take 1 tablet (50 mg total) by mouth daily.  . MULTIPLE VITAMINS-MINERALS ER PO Take 1 tablet by mouth daily.  . Nutritional Supplements (ESTROVEN PM PO) Take by mouth daily.  . rosuvastatin (CRESTOR) 10 MG tablet TAKE 1 TABLET EVERY DAY  . [DISCONTINUED] COMBIGAN 0.2-0.5 % ophthalmic solution TAKE 1 DROP(S) IN BOTH EYES 2 TIMES A DAY -- PLEASE DISPENSE LARGEST QUANTITY ALLOWED BY INSURANCE   No facility-administered encounter medications on file as of 11/28/2016.     Allergies: Lipitor [atorvastatin]  Social History   Social History  . Marital status: Married    Spouse name: N/A  . Number of children: N/A  . Years of education: N/A   Occupational History  . Not on file.   Social History Main Topics  . Smoking status: Never Smoker  . Smokeless tobacco: Never Used  . Alcohol use No  . Drug use: No  . Sexual activity: Not Currently   Other Topics Concern  . Not on file   Social History Narrative   She is married   She has four grand-children. Two of them live in [place] and two here in town.        Family History  Problem Relation Age of Onset  . Heart  disease Mother   . CAD Father   . Heart attack Father 64  . Cancer Brother   . Breast cancer Neg Hx     Review of Systems: A 12-system review of systems was performed and was negative except as noted in the HPI.  --------------------------------------------------------------------------------------------------  Physical Exam: BP 124/68 (BP Location: Left Arm, Patient Position: Sitting, Cuff Size: Normal)   Pulse  66   Ht 5' 5"  (1.651 m)   Wt 140 lb (63.5 kg)   BMI 23.30 kg/m   General:  Well-developed, well-nourished woman, seated comfortably in the exam room. HEENT: No conjunctival pallor or scleral icterus.  Moist mucous membranes.  OP clear. Neck: Supple without lymphadenopathy, thyromegaly, JVD, or HJR. Lungs: Normal work of breathing.  Clear to auscultation bilaterally without wheezes or crackles. Heart: Regular rate and rhythm without murmurs, rubs, or gallops.  Non-displaced PMI. Abd: Bowel sounds present.  Soft, NT/ND without hepatosplenomegaly Ext: No lower extremity edema.  Radial, PT, and DP pulses are 2+ bilaterally. Skin: warm and dry without rash  EKG:  Normal sinus rhythm with low voltage. No significant abnormalities. Heart rate has increased since 05/29/16.  Lab Results  Component Value Date   WBC 7.5 05/22/2016   HGB 13.4 05/22/2016   HCT 41.3 05/22/2016   MCV 91.2 05/22/2016   PLT 228 05/22/2016    Lab Results  Component Value Date   NA 140 05/22/2016   K 5.2 05/22/2016   CL 105 05/22/2016   CO2 28 05/22/2016   BUN 11 05/22/2016   CREATININE 0.95 (H) 05/22/2016   GLUCOSE 104 (H) 05/22/2016   ALT 11 05/22/2016    Lab Results  Component Value Date   CHOL 187 05/22/2016   HDL 67 05/22/2016   LDLCALC 101 05/22/2016   TRIG 93 05/22/2016   CHOLHDL 2.8 05/22/2016   Lab Results  Component Value Date   TSH 2.21 05/22/2016   --------------------------------------------------------------------------------------------------  ASSESSMENT AND PLAN: Sinus bradycardia Heart rate is normal today with discontinuation of . Patient is without symptoms. EKG demonstrates normal sinus rhythm with normal intervals. We have agreed to defer further workup at this time.  Hypertension Blood pressure is well controlled today. [name] should continue with losartan and follow-up with Dr. Ancil Boozer, as previously arranged.  Indigestion Single episode noted last week. [name] has not  had any additional symptoms including exertional chest pain or shortness of breath. I suspect her symptoms were likely GI in nature. However if she has recurrent chest pain, particularly with exertion, ischemia evaluation will need to be considered.  Follow-up: Return to clinic as needed.  Nelva Bush, MD 11/29/2016 7:40 AM

## 2016-11-28 NOTE — Patient Instructions (Signed)
Medication Instructions:  Your physician recommends that you continue on your current medications as directed. Please refer to the Current Medication list given to you today.   Labwork: none  Testing/Procedures: none  Follow-Up: Your physician recommends that you schedule a follow-up appointment as needed with Dr. Okey DupreEnd.    Any Other Special Instructions Will Be Listed Below (If Applicable).     If you need a refill on your cardiac medications before your next appointment, please call your pharmacy.

## 2016-11-29 ENCOUNTER — Encounter: Payer: Self-pay | Admitting: Internal Medicine

## 2016-12-03 ENCOUNTER — Telehealth: Payer: Self-pay | Admitting: Family Medicine

## 2016-12-03 NOTE — Telephone Encounter (Signed)
Pt is interested in taking a new supplement called Move Free for the joints, cartilage and bones. She wanted to know if it is safe for her to take. Please return call [phone]

## 2016-12-04 NOTE — Telephone Encounter (Signed)
It should be fine, chondroitin, glucosamine and vitamin D

## 2016-12-04 NOTE — Telephone Encounter (Signed)
Patient notified

## 2017-01-17 ENCOUNTER — Other Ambulatory Visit: Payer: Self-pay | Admitting: Family Medicine

## 2017-01-17 NOTE — Telephone Encounter (Signed)
Patient requesting refill of Crestor to [facility].

## 2017-01-21 ENCOUNTER — Encounter: Payer: Self-pay | Admitting: Family Medicine

## 2017-01-21 ENCOUNTER — Ambulatory Visit (INDEPENDENT_AMBULATORY_CARE_PROVIDER_SITE_OTHER): Payer: Medicare PPO | Admitting: Family Medicine

## 2017-01-21 VITALS — BP 132/84 | HR 60 | Temp 98.5°F | Resp 16 | Ht 65.0 in | Wt 138.2 lb

## 2017-01-21 DIAGNOSIS — R634 Abnormal weight loss: Secondary | ICD-10-CM

## 2017-01-21 DIAGNOSIS — E559 Vitamin D deficiency, unspecified: Secondary | ICD-10-CM

## 2017-01-21 DIAGNOSIS — R7301 Impaired fasting glucose: Secondary | ICD-10-CM | POA: Diagnosis not present

## 2017-01-21 DIAGNOSIS — I10 Essential (primary) hypertension: Secondary | ICD-10-CM

## 2017-01-21 DIAGNOSIS — E785 Hyperlipidemia, unspecified: Secondary | ICD-10-CM | POA: Diagnosis not present

## 2017-01-21 DIAGNOSIS — Z1231 Encounter for screening mammogram for malignant neoplasm of breast: Secondary | ICD-10-CM

## 2017-01-21 DIAGNOSIS — Z79899 Other long term (current) drug therapy: Secondary | ICD-10-CM | POA: Diagnosis not present

## 2017-01-21 LAB — COMPLETE METABOLIC PANEL WITH GFR
ALT: 12 U/L (ref 6–29)
AST: 20 U/L (ref 10–35)
Albumin: 4.4 g/dL (ref 3.6–5.1)
Alkaline Phosphatase: 79 U/L (ref 33–130)
BUN: 8 mg/dL (ref 7–25)
CALCIUM: 9.9 mg/dL (ref 8.6–10.4)
CHLORIDE: 105 mmol/L (ref 98–110)
CO2: 23 mmol/L (ref 20–31)
CREATININE: 0.92 mg/dL (ref 0.60–0.93)
GFR, Est African American: 71 mL/min (ref >=60)
GFR, Est Non African American: 62 mL/min (ref >=60)
Glucose, Bld: 97 mg/dL (ref 65–99)
Potassium: 4.7 mmol/L (ref 3.5–5.3)
Sodium: 141 mmol/L (ref 135–146)
Total Bilirubin: 0.5 mg/dL (ref 0.2–1.2)
Total Protein: 7.1 g/dL (ref 6.1–8.1)

## 2017-01-21 LAB — CBC WITH DIFFERENTIAL/PLATELET
BASOS PCT: 1 %
Basophils Absolute: 53 cells/uL (ref 0–200)
EOS ABS: 265 {cells}/uL (ref 15–500)
EOS PCT: 5 %
HCT: 40.9 % (ref 35.0–45.0)
Hemoglobin: 12.7 g/dL (ref 11.7–15.5)
LYMPHS PCT: 35 %
Lymphs Abs: 1855 cells/uL (ref 850–3900)
MCH: 28.6 pg (ref 27.0–33.0)
MCHC: 31.1 g/dL — ABNORMAL LOW (ref 32.0–36.0)
MCV: 92.1 fL (ref 80.0–100.0)
MONOS PCT: 6 %
MPV: 9.3 fL (ref 7.5–12.5)
Monocytes Absolute: 318 cells/uL (ref 200–950)
Neutro Abs: 2809 cells/uL (ref 1500–7800)
Neutrophils Relative %: 53 %
PLATELETS: 253 10*3/uL (ref 140–400)
RBC: 4.44 MIL/uL (ref 3.80–5.10)
RDW: 14.2 % (ref 11.0–15.0)
WBC: 5.3 10*3/uL (ref 3.8–10.8)

## 2017-01-21 LAB — LIPID PANEL
CHOLESTEROL: 173 mg/dL (ref <200)
HDL: 66 mg/dL (ref >50)
LDL Cholesterol: 86 mg/dL (ref <100)
Total CHOL/HDL Ratio: 2.6 Ratio (ref <5.0)
Triglycerides: 107 mg/dL (ref <150)
VLDL: 21 mg/dL (ref <30)

## 2017-01-21 LAB — TSH: TSH: 1.74 mIU/L

## 2017-01-21 MED ORDER — LOSARTAN POTASSIUM 50 MG PO TABS: 50.0000 mg | ORAL_TABLET | Freq: Every day | ORAL | 1 refills | Status: DC

## 2017-01-21 MED ORDER — ROSUVASTATIN CALCIUM 10 MG PO TABS: 10.0000 mg | ORAL_TABLET | Freq: Every day | ORAL | 2 refills | Status: DC

## 2017-01-21 NOTE — Progress Notes (Signed)
Name: Yvonne Andrews   MRN: [id number]    DOB: 11/26/1940   Date:01/21/2017       Progress Note  Subjective  Chief Complaint  Chief Complaint  Patient presents with  . Hypertension    4 month follow up    HPI  HTN: Patient has been compliant with medication and denies side effects, no chest pain, no palpitation, seen by Dr. Okey Dupre - cardiologist in May 2018 and EKG showed sinus rhythm, therefore no further testing was done for bradycardia.   Hyperlipidemia: she has been on Crestor , in the past had elevated LFT's with Lipitor, we tried Pravastatin but did not work. No myalgias. Last labs was at goal and LFT"s normal , we will recheck labs today   Prediabetes/fasting hyperglycemia: hgbA1C was 6.24 July 2015, but down to 5.8% back in October 2018, she has changed her diet, cutting down on breads, also on sweets.   She denies polydipsia, polyuria or polyphagia. She has lost 4 lbs since last visit, but has been seems to go up and down since 12/2014   OA right knee: she has intermittent pain on right medial knee , and over the right patella when going down stairs. No effusion, no redness. Pain level at this time is zero. She take Tylenol prn very seldom.  Vitamin D deficiency: taking otc supplementation  Patient Active Problem List   Diagnosis Date Noted  . Vitamin D deficiency 05/29/2016  . Intraocular pressure increase 07/04/2015  . Bilateral tinnitus 07/04/2015  . Benign essential HTN 12/31/2014  . Carpal tunnel syndrome 12/31/2014  . Dyslipidemia 12/31/2014  . Ovarian failure 12/31/2014  . Osteopenia 12/31/2014  . Allergic rhinitis 12/31/2014  . Arthritis of knee, degenerative 12/31/2014  . Acquired trigger finger 12/31/2014  . Cataracts, bilateral 12/31/2014  . Fasting hyperglycemia 12/31/2014  . LBP (low back pain) 05/27/2007    Past Surgical History:  Procedure Laterality Date  . TRIGGER FINGER RELEASE    . TUBAL LIGATION      Family History  Problem Relation Age  of Onset  . Heart disease Mother   . CAD Father   . Heart attack Father 67  . Cancer Brother   . Breast cancer Neg Hx     Social History   Social History  . Marital status: Married    Spouse name: N/A  . Number of children: N/A  . Years of education: N/A   Occupational History  . Not on file.   Social History Main Topics  . Smoking status: Never Smoker  . Smokeless tobacco: Never Used  . Alcohol use No  . Drug use: No  . Sexual activity: Not Currently   Other Topics Concern  . Not on file   Social History Narrative   She is married   She has four grand-children. Two of them live in [place] and two here in town.         Current Outpatient Prescriptions:  .  aspirin EC 81 MG tablet, Take 1 tablet (81 mg total) by mouth daily., Disp: 30 tablet, Rfl: 0 .  Calcium Carbonate 500 MG CHEW, Chew 1 tablet by mouth daily., Disp: , Rfl:  .  Cholecalciferol (VITAMIN D) 2000 UNITS CAPS, Take 1 capsule (2,000 Units total) by mouth daily., Disp: 30 capsule, Rfl: 0 .  losartan (COZAAR) 50 MG tablet, Take 1 tablet (50 mg total) by mouth daily., Disp: 90 tablet, Rfl: 1 .  MULTIPLE VITAMINS-MINERALS ER PO, Take 1 tablet by mouth daily.,  Disp: , Rfl:  .  Nutritional Supplements (ESTROVEN PM PO), Take by mouth daily., Disp: , Rfl:  .  rosuvastatin (CRESTOR) 10 MG tablet, Take 1 tablet (10 mg total) by mouth daily., Disp: 90 tablet, Rfl: 2 .  timolol (TIMOPTIC) 0.5 % ophthalmic solution, , Disp: , Rfl:   Allergies  Allergen Reactions  . Lipitor [Atorvastatin] Other (See Comments)    Elevation of LFT's     ROS  Constitutional: Negative for fever, positive for weight change.  Respiratory: Negative for cough ( needs to clear throat every so often) and shortness of breath.   Cardiovascular: Negative for chest pain or palpitations.  Gastrointestinal: Negative for abdominal pain ( she has one episode of LLQ pain about one week ago - over the weekend but resolved by itself- advised to go to  Urgent Care if it happens again, or come here) , no bowel changes.  Musculoskeletal: Negative for gait problem or joint swelling.  Skin: Negative for rash.  Neurological: Negative for dizziness or headache.   No other specific complaints in a complete review of systems (except as listed in HPI above).  Objective  Vitals:   01/21/17 0807  BP: 132/84  Pulse: 60  Resp: 16  Temp: 98.5 F (36.9 C)  SpO2: 99%  Weight: 138 lb 3 oz (62.7 kg)  Height: 5\' 5"  (1.651 m)    Body mass index is 23 kg/m.  Physical Exam  Constitutional: Patient appears well-developed and well-nourished.  No distress.  HEENT: head atraumatic, normocephalic, pupils equal and reactive to light,  neck supple, throat within normal limits Cardiovascular: Normal rate, regular rhythm and normal heart sounds.  No murmur heard. No BLE edema. Pulmonary/Chest: Effort normal and breath sounds normal. No respiratory distress. Abdominal: Soft.  There is no tenderness. Psychiatric: Patient has a normal mood and affect. behavior is normal. Judgment and thought content normal.  PHQ2/9: Depression screen [facility] 2/9 01/21/2017 09/20/2016 05/22/2016 11/17/2015 09/19/2015  Decreased Interest 0 0 0 0 0  Down, Depressed, Hopeless 0 0 0 0 0  PHQ - 2 Score 0 0 0 0 0     Fall Risk: Fall Risk  01/21/2017 09/20/2016 05/22/2016 11/17/2015 09/19/2015  Falls in the past year? No No No No No     Assessment & Plan  1. Fasting hyperglycemia  - Hemoglobin A1c  2. Benign essential HTN  At goal  - CBC with Differential/Platelet - COMPLETE METABOLIC PANEL WITH GFR - losartan (COZAAR) 50 MG tablet; Take 1 tablet (50 mg total) by mouth daily.  Dispense: 90 tablet; Refill: 1  3. Dyslipidemia  - Lipid panel - rosuvastatin (CRESTOR) 10 MG tablet; Take 1 tablet (10 mg total) by mouth daily.  Dispense: 90 tablet; Refill: 2  4. Long-term use of high-risk medication  Checking labs  5. Weight loss  - TSH - HIV antibody - C-reactive  protein - Sedimentation rate  6. Vitamin D deficiency  - VITAMIN D 25 Hydroxy (Vit-D Deficiency, Fractures)

## 2017-01-22 LAB — HEMOGLOBIN A1C
Hgb A1c MFr Bld: 5.9 % — ABNORMAL HIGH (ref <5.7)
Mean Plasma Glucose: 123 mg/dL

## 2017-01-22 LAB — HIV ANTIBODY (ROUTINE TESTING W REFLEX): HIV 1&2 Ab, 4th Generation: NONREACTIVE

## 2017-01-22 LAB — C-REACTIVE PROTEIN: CRP: 4.3 mg/L (ref <8.0)

## 2017-01-22 LAB — SEDIMENTATION RATE: SED RATE: 5 mm/h (ref 0–30)

## 2017-01-22 LAB — VITAMIN D 25 HYDROXY (VIT D DEFICIENCY, FRACTURES): Vit D, 25-Hydroxy: 71 ng/mL (ref 30–100)

## 2017-02-01 ENCOUNTER — Ambulatory Visit
Admission: RE | Admit: 2017-02-01 | Discharge: 2017-02-01 | Disposition: A | Discharge status: Home / Self Care | Payer: Medicare PPO | Source: Ambulatory Visit | Attending: Family Medicine | Admitting: Family Medicine

## 2017-02-01 DIAGNOSIS — Z1231 Encounter for screening mammogram for malignant neoplasm of breast: Secondary | ICD-10-CM

## 2017-03-08 ENCOUNTER — Ambulatory Visit (INDEPENDENT_AMBULATORY_CARE_PROVIDER_SITE_OTHER): Payer: Medicare PPO | Admitting: Family Medicine

## 2017-03-08 ENCOUNTER — Encounter: Payer: Self-pay | Admitting: Family Medicine

## 2017-03-08 VITALS — BP 124/86 | HR 64 | Temp 98.5°F | Resp 16 | Ht 65.0 in | Wt 139.6 lb

## 2017-03-08 DIAGNOSIS — K5790 Diverticulosis of intestine, part unspecified, without perforation or abscess without bleeding: Secondary | ICD-10-CM | POA: Diagnosis not present

## 2017-03-08 DIAGNOSIS — K5792 Diverticulitis of intestine, part unspecified, without perforation or abscess without bleeding: Secondary | ICD-10-CM

## 2017-03-08 DIAGNOSIS — R110 Nausea: Secondary | ICD-10-CM

## 2017-03-08 LAB — POC HEMOCCULT BLD/STL (OFFICE/1-CARD/DIAGNOSTIC): FECAL OCCULT BLD: NEGATIVE

## 2017-03-08 MED ORDER — CIPROFLOXACIN HCL 250 MG PO TABS: 500.0000 mg | ORAL_TABLET | Freq: Two times a day (BID) | ORAL | 0 refills | Status: DC

## 2017-03-08 MED ORDER — METRONIDAZOLE 500 MG PO TABS: 500.0000 mg | ORAL_TABLET | Freq: Three times a day (TID) | ORAL | 0 refills | Status: DC

## 2017-03-08 NOTE — Patient Instructions (Addendum)

## 2017-03-08 NOTE — Progress Notes (Addendum)
Name: Yvonne Andrews   MRN: [id number]    DOB: June 01, 1941   Date:03/08/2017       Progress Note  Subjective  Chief Complaint  Chief Complaint  Patient presents with  . Diverticulitis    flare up,   . Abdominal Pain    HPI  Pt presents with concern for diverticulitis - she has a history of this in the past and says her symptoms are very similar today. Symptoms began last night - LUQ abdominal pain that is waxing and waning and described as sharp; nausea with dry heaving this morning. Last BM was this morning and it was normal - no diarrhea, constipation, dark/tarry or pale stools, bloody stools.  Was treated for diverticulitis at [facility] care over a year ago.  Had colonoscopy in 2010 and was noted to have diverticulosis and hemorrhoids.   Patient Active Problem List   Diagnosis Date Noted  . Diverticulosis 03/08/2017  . Vitamin D deficiency 05/29/2016  . Intraocular pressure increase 07/04/2015  . Bilateral tinnitus 07/04/2015  . Benign essential HTN 12/31/2014  . Carpal tunnel syndrome 12/31/2014  . Dyslipidemia 12/31/2014  . Ovarian failure 12/31/2014  . Osteopenia 12/31/2014  . Allergic rhinitis 12/31/2014  . Arthritis of knee, degenerative 12/31/2014  . Acquired trigger finger 12/31/2014  . Cataracts, bilateral 12/31/2014  . Fasting hyperglycemia 12/31/2014  . LBP (low back pain) 05/27/2007    Social History  Substance Use Topics  . Smoking status: Never Smoker  . Smokeless tobacco: Never Used  . Alcohol use No     Current Outpatient Prescriptions:  .  aspirin EC 81 MG tablet, Take 1 tablet (81 mg total) by mouth daily., Disp: 30 tablet, Rfl: 0 .  Calcium Carbonate 500 MG CHEW, Chew 1 tablet by mouth daily., Disp: , Rfl:  .  Cholecalciferol (VITAMIN D) 2000 UNITS CAPS, Take 1 capsule (2,000 Units total) by mouth daily., Disp: 30 capsule, Rfl: 0 .  losartan (COZAAR) 50 MG tablet, Take 1 tablet (50 mg total) by mouth daily., Disp: 90 tablet, Rfl: 1 .   MULTIPLE VITAMINS-MINERALS ER PO, Take 1 tablet by mouth daily., Disp: , Rfl:  .  Nutritional Supplements (ESTROVEN PM PO), Take by mouth daily., Disp: , Rfl:  .  rosuvastatin (CRESTOR) 10 MG tablet, Take 1 tablet (10 mg total) by mouth daily., Disp: 90 tablet, Rfl: 2 .  timolol (TIMOPTIC) 0.5 % ophthalmic solution, , Disp: , Rfl:  .  ciprofloxacin (CIPRO) 250 MG tablet, Take 2 tablets (500 mg total) by mouth 2 (two) times daily., Disp: 28 tablet, Rfl: 0 .  metroNIDAZOLE (FLAGYL) 500 MG tablet, Take 1 tablet (500 mg total) by mouth 3 (three) times daily., Disp: 21 tablet, Rfl: 0  Allergies  Allergen Reactions  . Lipitor [Atorvastatin] Other (See Comments)    Elevation of LFT's    ROS  Constitutional: Negative for fevers/chills or weight change.  Respiratory: Negative for cough and shortness of breath.   Cardiovascular: Negative for chest pain or palpitations.  Gastrointestinal: Positive for abdominal pain; no bowel changes. Positive for nausea and dry heaving. GU: No dysuria, no polyuria. Musculoskeletal: Negative for gait problem or joint swelling.  Skin: Negative for rash.  Neurological: Negative for dizziness or headache.  No other specific complaints in a complete review of systems (except as listed in HPI above).  Objective  Vitals:   03/08/17 1031  BP: 124/86  Pulse: 64  Resp: 16  Temp: 98.5 F (36.9 C)  TempSrc: Oral  SpO2:  93%  Weight: 139 lb 9.6 oz (63.3 kg)  Height: 5' 5"  (1.651 m)    Body mass index is 23.23 kg/m.  Nursing Note and Vital Signs reviewed.  Physical Exam  Constitutional: Patient appears well-developed and well-nourished. No distress.  HEENT: head atraumatic, normocephalic Cardiovascular: Normal rate, regular rhythm, S1/S2 present.  No murmur or rub heard. No BLE edema. Pulmonary/Chest: Effort normal and breath sounds clear. No respiratory distress or retractions. Abdominal: Soft; tenderness to LUQ and mild tenderness to LLQ, bowel sounds  present x4 quadrants.  No CVA Tenderness. Negative hemoccult. Psychiatric: Patient has a normal mood and affect. behavior is normal. Judgment and thought content normal.  Recent Results (from the past 2160 hour(s))  CBC with Differential/Platelet     Status: Abnormal   Collection Time: 01/21/17  8:37 AM  Result Value Ref Range   WBC 5.3 3.8 - 10.8 K/uL   RBC 4.44 3.80 - 5.10 MIL/uL   Hemoglobin 12.7 11.7 - 15.5 g/dL   HCT 40.9 35.0 - 45.0 %   MCV 92.1 80.0 - 100.0 fL   MCH 28.6 27.0 - 33.0 pg   MCHC 31.1 (L) 32.0 - 36.0 g/dL   RDW 14.2 11.0 - 15.0 %   Platelets 253 140 - 400 K/uL   MPV 9.3 7.5 - 12.5 fL   Neutro Abs 2,809 1,500 - 7,800 cells/uL   Lymphs Abs 1,855 850 - 3,900 cells/uL   Monocytes Absolute 318 200 - 950 cells/uL   Eosinophils Absolute 265 15 - 500 cells/uL   Basophils Absolute 53 0 - 200 cells/uL   Neutrophils Relative % 53 %   Lymphocytes Relative 35 %   Monocytes Relative 6 %   Eosinophils Relative 5 %   Basophils Relative 1 %   Smear Review Criteria for review not met   Hemoglobin A1c     Status: Abnormal   Collection Time: 01/21/17  8:37 AM  Result Value Ref Range   Hgb A1c MFr Bld 5.9 (H) <5.7 %    Comment:   For someone without known diabetes, a hemoglobin A1c value between 5.7% and 6.4% is consistent with prediabetes and should be confirmed with a follow-up test.   For someone with known diabetes, a value <7% indicates that their diabetes is well controlled. A1c targets should be individualized based on duration of diabetes, age, co-morbid conditions and other considerations.   This assay result is consistent with an increased risk of diabetes.   Currently, no consensus exists regarding use of hemoglobin A1c for diagnosis of diabetes in children.      Mean Plasma Glucose 123 mg/dL  Lipid panel     Status: None   Collection Time: 01/21/17  8:37 AM  Result Value Ref Range   Cholesterol 173 <200 mg/dL   Triglycerides 107 <150 mg/dL   HDL 66 >50  mg/dL   Total CHOL/HDL Ratio 2.6 <5.0 Ratio   VLDL 21 <30 mg/dL   LDL Cholesterol 86 <100 mg/dL  COMPLETE METABOLIC PANEL WITH GFR     Status: None   Collection Time: 01/21/17  8:37 AM  Result Value Ref Range   Sodium 141 135 - 146 mmol/L   Potassium 4.7 3.5 - 5.3 mmol/L   Chloride 105 98 - 110 mmol/L   CO2 23 20 - 31 mmol/L   Glucose, Bld 97 65 - 99 mg/dL   BUN 8 7 - 25 mg/dL   Creat 0.92 0.60 - 0.93 mg/dL    Comment:   For patients >  or = 74 years of age: The upper reference limit for Creatinine is approximately 13% higher for people identified as African-American.      Total Bilirubin 0.5 0.2 - 1.2 mg/dL   Alkaline Phosphatase 79 33 - 130 U/L   AST 20 10 - 35 U/L   ALT 12 6 - 29 U/L   Total Protein 7.1 6.1 - 8.1 g/dL   Albumin 4.4 3.6 - 5.1 g/dL   Calcium 9.9 8.6 - 10.4 mg/dL   GFR, Est African American 71 >=60 mL/min   GFR, Est Non African American 62 >=60 mL/min  TSH     Status: None   Collection Time: 01/21/17  8:37 AM  Result Value Ref Range   TSH 1.74 mIU/L    Comment:   Reference Range   > or = 20 Years  0.40-4.50   Pregnancy Range First trimester  0.26-2.66 Second trimester 0.55-2.73 Third trimester  0.43-2.91     VITAMIN D 25 Hydroxy (Vit-D Deficiency, Fractures)     Status: None   Collection Time: 01/21/17  8:37 AM  Result Value Ref Range   Vit D, 25-Hydroxy 71 30 - 100 ng/mL    Comment: Vitamin D Status           25-OH Vitamin D        Deficiency                <20 ng/mL        Insufficiency         20 - 29 ng/mL        Optimal             > or = 30 ng/mL   For 25-OH Vitamin D testing on patients on D2-supplementation and patients for whom quantitation of D2 and D3 fractions is required, the [facility] 25-OH VIT D, (D2,D3), LC/MS/MS is recommended: order code [phone] (patients > 2 yrs).   HIV antibody     Status: None   Collection Time: 01/21/17  8:37 AM  Result Value Ref Range   HIV 1&2 Ab, 4th Generation NONREACTIVE NONREACTIVE    Comment:    HIV-1 antigen and HIV-1/HIV-2 antibodies were not detected.  There is no laboratory evidence of HIV infection.   HIV-1/2 Antibody Diff        Not indicated. HIV-1 RNA, Qual TMA          Not indicated.     PLEASE NOTE: This information has been disclosed to you from records whose confidentiality may be protected by state law. If your state requires such protection, then the state law prohibits you from making any further disclosure of the information without the specific written consent of the person to whom it pertains, or as otherwise permitted by law. A general authorization for the release of medical or other information is NOT sufficient for this purpose.   The performance of this assay has not been clinically validated in patients less than 84 years old.   For additional information please refer to http://education.quest[facility].com/faq/FAQ106.  (This link is being provided for informational/educational purposes only.)     C-reactive protein     Status: None   Collection Time: 01/21/17  8:37 AM  Result Value Ref Range   CRP 4.3 <8.0 mg/L  Sedimentation rate     Status: None   Collection Time: 01/21/17  8:37 AM  Result Value Ref Range   Sed Rate 5 0 - 30 mm/hr  POC Hemoccult Bld/Stl (1-Cd Office Dx)  Status: Normal   Collection Time: 03/08/17  1:31 PM  Result Value Ref Range   Card #1 Date 03/08/2017    Fecal Occult Blood, POC Negative Negative     Assessment & Plan  1. Diverticulitis - metroNIDAZOLE (FLAGYL) 500 MG tablet; Take 1 tablet (500 mg total) by mouth 3 (three) times daily.  Dispense: 21 tablet; Refill: 0 - ciprofloxacin (CIPRO) 250 MG tablet; Take 2 tablets (500 mg total) by mouth 2 (two) times daily.  Dispense: 28 tablet; Refill: 0 - POCT occult blood stool, device  2. Diverticulosis of intestine without bleeding, unspecified intestinal tract location - POCT occult blood stool, device  3. Nausea Bland diet and diet for diverticulitis discussed  with patient.  -Red flags and when to present for emergency care or RTC including fever >101.44F, constipation or diarrhea, stool changes - dark/tarry, bright red blood, pale/yellow, severe or increase in abdominal pain, vomiting, new/worsening/un-resolving symptoms, reviewed with patient at time of visit. Follow up and care instructions discussed and provided in AVS.  Return for 7-10 days for follow up.   I have reviewed this encounter including the documentation in this note and/or discussed this patient with the Johney Maine, FNP, NP-C. I am certifying that I agree with the content of this note as supervising physician.  Steele Sizer, MD [facility] 03/10/2017, 4:23 PM

## 2017-03-12 ENCOUNTER — Telehealth: Payer: Self-pay | Admitting: Family Medicine

## 2017-03-12 NOTE — Telephone Encounter (Signed)
Can she return for follow up?

## 2017-03-12 NOTE — Telephone Encounter (Signed)
Pt said that the meds that emily placed her on last fRIDAY has made her feel worse. The meds is Metronidacole 1 tablet 3 x a day mg and Ciprofloxacin 2 tablets 2x a day. Please give her a call back to let her know what she needs to do. Says the pain is gone but just has no energy and no appetite.

## 2017-03-13 ENCOUNTER — Encounter: Payer: Self-pay | Admitting: Family Medicine

## 2017-03-13 ENCOUNTER — Ambulatory Visit (INDEPENDENT_AMBULATORY_CARE_PROVIDER_SITE_OTHER): Payer: Medicare PPO | Admitting: Family Medicine

## 2017-03-13 VITALS — BP 140/80 | HR 67 | Temp 98.1°F | Resp 16 | Ht 65.0 in | Wt 138.0 lb

## 2017-03-13 DIAGNOSIS — R5383 Other fatigue: Secondary | ICD-10-CM | POA: Diagnosis not present

## 2017-03-13 DIAGNOSIS — R110 Nausea: Secondary | ICD-10-CM

## 2017-03-13 DIAGNOSIS — K5792 Diverticulitis of intestine, part unspecified, without perforation or abscess without bleeding: Secondary | ICD-10-CM

## 2017-03-13 LAB — CBC WITH DIFFERENTIAL/PLATELET
Basophils Absolute: 54 cells/uL (ref 0–200)
Basophils Relative: 1 %
EOS PCT: 3 %
Eosinophils Absolute: 162 cells/uL (ref 15–500)
HCT: 42.7 % (ref 35.0–45.0)
Hemoglobin: 13.9 g/dL (ref 11.7–15.5)
LYMPHS PCT: 32 %
Lymphs Abs: 1728 cells/uL (ref 850–3900)
MCH: 29.6 pg (ref 27.0–33.0)
MCHC: 32.6 g/dL (ref 32.0–36.0)
MCV: 91 fL (ref 80.0–100.0)
MPV: 10.3 fL (ref 7.5–12.5)
Monocytes Absolute: 432 cells/uL (ref 200–950)
Monocytes Relative: 8 %
NEUTROS PCT: 56 %
Neutro Abs: 3024 cells/uL (ref 1500–7800)
PLATELETS: 214 10*3/uL (ref 140–400)
RBC: 4.69 MIL/uL (ref 3.80–5.10)
RDW: 14.7 % (ref 11.0–15.0)
WBC: 5.4 10*3/uL (ref 3.8–10.8)

## 2017-03-13 NOTE — Progress Notes (Signed)
Name: Yvonne Andrews   MRN: [id number]    DOB: February 03, 1941   Date:03/13/2017       Progress Note  Subjective  Chief Complaint  Chief Complaint  Patient presents with  . Medication Problem    pt stated that medication she was given last week for diverticulitis has made her feel worse    HPI   Diverticulitis/nausea/fatigue: she was seen by Raelyn Ensign NP 6 days ago and started on Flagyl and Cipro, she states she felt better for a couple days, able to go to church, no diarrhea, abdominal resolved within 48 hours of taking antibiotics, but yesterday after taking medication she felt sick, nausea, weak and stopped taking it since. She states no recurrence of abdominal pain and appetite is improving again, no blood in stools, felt mild dizziness but has improved. Gave reassurance, advised to monitor for recurrence of abdominal pain , and we can call in augmentin, we will check labs to make sure medication did not affect her kidney of liver function.   Patient Active Problem List   Diagnosis Date Noted  . Diverticulosis 03/08/2017  . Vitamin D deficiency 05/29/2016  . Intraocular pressure increase 07/04/2015  . Bilateral tinnitus 07/04/2015  . Benign essential HTN 12/31/2014  . Carpal tunnel syndrome 12/31/2014  . Dyslipidemia 12/31/2014  . Ovarian failure 12/31/2014  . Osteopenia 12/31/2014  . Allergic rhinitis 12/31/2014  . Arthritis of knee, degenerative 12/31/2014  . Acquired trigger finger 12/31/2014  . Cataracts, bilateral 12/31/2014  . Fasting hyperglycemia 12/31/2014  . LBP (low back pain) 05/27/2007    Past Surgical History:  Procedure Laterality Date  . TRIGGER FINGER RELEASE    . TUBAL LIGATION      Family History  Problem Relation Age of Onset  . Heart disease Mother   . CAD Father   . Heart attack Father 1  . Cancer Brother   . Breast cancer Neg Hx     Social History   Social History  . Marital status: Married    Spouse name: N/A  . Number of children: N/A   . Years of education: N/A   Occupational History  . Not on file.   Social History Main Topics  . Smoking status: Never Smoker  . Smokeless tobacco: Never Used  . Alcohol use No  . Drug use: No  . Sexual activity: Not Currently   Other Topics Concern  . Not on file   Social History Narrative   She is married   She has four grand-children. Two of them live in [place] and two here in town.         Current Outpatient Prescriptions:  .  aspirin EC 81 MG tablet, Take 1 tablet (81 mg total) by mouth daily., Disp: 30 tablet, Rfl: 0 .  Calcium Carbonate 500 MG CHEW, Chew 1 tablet by mouth daily., Disp: , Rfl:  .  Cholecalciferol (VITAMIN D) 2000 UNITS CAPS, Take 1 capsule (2,000 Units total) by mouth daily., Disp: 30 capsule, Rfl: 0 .  losartan (COZAAR) 50 MG tablet, Take 1 tablet (50 mg total) by mouth daily., Disp: 90 tablet, Rfl: 1 .  MULTIPLE VITAMINS-MINERALS ER PO, Take 1 tablet by mouth daily., Disp: , Rfl:  .  Nutritional Supplements (ESTROVEN PM PO), Take by mouth daily., Disp: , Rfl:  .  rosuvastatin (CRESTOR) 10 MG tablet, Take 1 tablet (10 mg total) by mouth daily., Disp: 90 tablet, Rfl: 2 .  timolol (TIMOPTIC) 0.5 % ophthalmic solution, , Disp: ,  Rfl:   Allergies  Allergen Reactions  . Lipitor [Atorvastatin] Other (See Comments)    Elevation of LFT's     ROS  Ten systems reviewed and is negative except as mentioned in HPI  Objective  Vitals:   03/13/17 0930  BP: 140/80  Pulse: 67  Resp: 16  Temp: 98.1 F (36.7 C)  SpO2: 97%  Weight: 138 lb (62.6 kg)  Height: 5' 5"  (1.651 m)    Body mass index is 22.96 kg/m.  Physical Exam  Constitutional: Patient appears well-developed and well-nourished. No distress.  HEENT: head atraumatic, normocephalic, pupils equal and reactive to light,  neck supple, throat within normal limits Cardiovascular: Normal rate, regular rhythm and normal heart sounds.  No murmur heard. No BLE edema. Pulmonary/Chest: Effort normal  and breath sounds normal. No respiratory distress. Abdominal: Soft.  Mild epigastric pain.  Psychiatric: Patient has a normal mood and affect. behavior is normal. Judgment and thought content normal.  Recent Results (from the past 2160 hour(s))  CBC with Differential/Platelet     Status: Abnormal   Collection Time: 01/21/17  8:37 AM  Result Value Ref Range   WBC 5.3 3.8 - 10.8 K/uL   RBC 4.44 3.80 - 5.10 MIL/uL   Hemoglobin 12.7 11.7 - 15.5 g/dL   HCT 40.9 35.0 - 45.0 %   MCV 92.1 80.0 - 100.0 fL   MCH 28.6 27.0 - 33.0 pg   MCHC 31.1 (L) 32.0 - 36.0 g/dL   RDW 14.2 11.0 - 15.0 %   Platelets 253 140 - 400 K/uL   MPV 9.3 7.5 - 12.5 fL   Neutro Abs 2,809 1,500 - 7,800 cells/uL   Lymphs Abs 1,855 850 - 3,900 cells/uL   Monocytes Absolute 318 200 - 950 cells/uL   Eosinophils Absolute 265 15 - 500 cells/uL   Basophils Absolute 53 0 - 200 cells/uL   Neutrophils Relative % 53 %   Lymphocytes Relative 35 %   Monocytes Relative 6 %   Eosinophils Relative 5 %   Basophils Relative 1 %   Smear Review Criteria for review not met   Hemoglobin A1c     Status: Abnormal   Collection Time: 01/21/17  8:37 AM  Result Value Ref Range   Hgb A1c MFr Bld 5.9 (H) <5.7 %    Comment:   For someone without known diabetes, a hemoglobin A1c value between 5.7% and 6.4% is consistent with prediabetes and should be confirmed with a follow-up test.   For someone with known diabetes, a value <7% indicates that their diabetes is well controlled. A1c targets should be individualized based on duration of diabetes, age, co-morbid conditions and other considerations.   This assay result is consistent with an increased risk of diabetes.   Currently, no consensus exists regarding use of hemoglobin A1c for diagnosis of diabetes in children.      Mean Plasma Glucose 123 mg/dL  Lipid panel     Status: None   Collection Time: 01/21/17  8:37 AM  Result Value Ref Range   Cholesterol 173 <200 mg/dL    Triglycerides 107 <150 mg/dL   HDL 66 >50 mg/dL   Total CHOL/HDL Ratio 2.6 <5.0 Ratio   VLDL 21 <30 mg/dL   LDL Cholesterol 86 <100 mg/dL  COMPLETE METABOLIC PANEL WITH GFR     Status: None   Collection Time: 01/21/17  8:37 AM  Result Value Ref Range   Sodium 141 135 - 146 mmol/L   Potassium 4.7 3.5 - 5.3  mmol/L   Chloride 105 98 - 110 mmol/L   CO2 23 20 - 31 mmol/L   Glucose, Bld 97 65 - 99 mg/dL   BUN 8 7 - 25 mg/dL   Creat 0.92 0.60 - 0.93 mg/dL    Comment:   For patients > or = 74 years of age: The upper reference limit for Creatinine is approximately 13% higher for people identified as African-American.      Total Bilirubin 0.5 0.2 - 1.2 mg/dL   Alkaline Phosphatase 79 33 - 130 U/L   AST 20 10 - 35 U/L   ALT 12 6 - 29 U/L   Total Protein 7.1 6.1 - 8.1 g/dL   Albumin 4.4 3.6 - 5.1 g/dL   Calcium 9.9 8.6 - 10.4 mg/dL   GFR, Est African American 71 >=60 mL/min   GFR, Est Non African American 62 >=60 mL/min  TSH     Status: None   Collection Time: 01/21/17  8:37 AM  Result Value Ref Range   TSH 1.74 mIU/L    Comment:   Reference Range   > or = 20 Years  0.40-4.50   Pregnancy Range First trimester  0.26-2.66 Second trimester 0.55-2.73 Third trimester  0.43-2.91     VITAMIN D 25 Hydroxy (Vit-D Deficiency, Fractures)     Status: None   Collection Time: 01/21/17  8:37 AM  Result Value Ref Range   Vit D, 25-Hydroxy 71 30 - 100 ng/mL    Comment: Vitamin D Status           25-OH Vitamin D        Deficiency                <20 ng/mL        Insufficiency         20 - 29 ng/mL        Optimal             > or = 30 ng/mL   For 25-OH Vitamin D testing on patients on D2-supplementation and patients for whom quantitation of D2 and D3 fractions is required, the [facility] 25-OH VIT D, (D2,D3), LC/MS/MS is recommended: order code [id number] (patients > 2 yrs).   HIV antibody     Status: None   Collection Time: 01/21/17  8:37 AM  Result Value Ref Range   HIV 1&2 Ab, 4th  Generation NONREACTIVE NONREACTIVE    Comment:   HIV-1 antigen and HIV-1/HIV-2 antibodies were not detected.  There is no laboratory evidence of HIV infection.   HIV-1/2 Antibody Diff        Not indicated. HIV-1 RNA, Qual TMA          Not indicated.     PLEASE NOTE: This information has been disclosed to you from records whose confidentiality may be protected by state law. If your state requires such protection, then the state law prohibits you from making any further disclosure of the information without the specific written consent of the person to whom it pertains, or as otherwise permitted by law. A general authorization for the release of medical or other information is NOT sufficient for this purpose.   The performance of this assay has not been clinically validated in patients less than 40 years old.   For additional information please refer to http://education.[facility].com/faq/FAQ106.  (This link is being provided for informational/educational purposes only.)     C-reactive protein     Status: None   Collection Time: 01/21/17  8:37  AM  Result Value Ref Range   CRP 4.3 <8.0 mg/L  Sedimentation rate     Status: None   Collection Time: 01/21/17  8:37 AM  Result Value Ref Range   Sed Rate 5 0 - 30 mm/hr  POC Hemoccult Bld/Stl (1-Cd Office Dx)     Status: Normal   Collection Time: 03/08/17  1:31 PM  Result Value Ref Range   Card #1 Date 03/08/2017    Fecal Occult Blood, POC Negative Negative      PHQ2/9: Depression screen [facility] 2/9 01/21/2017 09/20/2016 05/22/2016 11/17/2015 09/19/2015  Decreased Interest 0 0 0 0 0  Down, Depressed, Hopeless 0 0 0 0 0  PHQ - 2 Score 0 0 0 0 0     Fall Risk: Fall Risk  01/21/2017 09/20/2016 05/22/2016 11/17/2015 09/19/2015  Falls in the past year? No No No No No     Assessment & Plan  1. Diverticulitis  She took 4.5 days of Flagyl and Cipro states pain has resolved  2. Other fatigue  She is concerned that secondary to  medication, she states appetite is improving and starting to feel better since she stopped medication yesterday - COMPLETE METABOLIC PANEL WITH GFR - CBC with Differential/Platelet  3. Nausea  - COMPLETE METABOLIC PANEL WITH GFR - CBC with Differential/Platelet Advised otc zantac, take probiotics, check labs

## 2017-03-13 NOTE — Telephone Encounter (Signed)
Pt coming in today 03-13-17 at 9:20.

## 2017-03-14 LAB — COMPLETE METABOLIC PANEL WITHOUT GFR
ALT: 13 U/L (ref 6–29)
AST: 24 U/L (ref 10–35)
Albumin: 4.6 g/dL (ref 3.6–5.1)
Alkaline Phosphatase: 81 U/L (ref 33–130)
BUN: 10 mg/dL (ref 7–25)
CO2: 20 mmol/L (ref 20–32)
Calcium: 10.6 mg/dL — ABNORMAL HIGH (ref 8.6–10.4)
Chloride: 104 mmol/L (ref 98–110)
Creat: 0.96 mg/dL — ABNORMAL HIGH (ref 0.60–0.93)
GFR, Est African American: 67 mL/min
GFR, Est Non African American: 58 mL/min — ABNORMAL LOW
Glucose, Bld: 93 mg/dL (ref 65–99)
Potassium: 4.8 mmol/L (ref 3.5–5.3)
Sodium: 141 mmol/L (ref 135–146)
Total Bilirubin: 0.5 mg/dL (ref 0.2–1.2)
Total Protein: 7.1 g/dL (ref 6.1–8.1)

## 2017-03-15 ENCOUNTER — Ambulatory Visit: Payer: Medicare PPO | Admitting: Family Medicine

## 2017-05-08 DIAGNOSIS — H40053 Ocular hypertension, bilateral: Secondary | ICD-10-CM | POA: Diagnosis not present

## 2017-05-24 ENCOUNTER — Ambulatory Visit (INDEPENDENT_AMBULATORY_CARE_PROVIDER_SITE_OTHER): Payer: Medicare PPO | Admitting: Family Medicine

## 2017-05-24 ENCOUNTER — Encounter: Payer: Self-pay | Admitting: Family Medicine

## 2017-05-24 VITALS — BP 130/70 | HR 67 | Resp 12 | Ht 65.0 in | Wt 142.1 lb

## 2017-05-24 DIAGNOSIS — E785 Hyperlipidemia, unspecified: Secondary | ICD-10-CM

## 2017-05-24 DIAGNOSIS — J302 Other seasonal allergic rhinitis: Secondary | ICD-10-CM | POA: Diagnosis not present

## 2017-05-24 DIAGNOSIS — R1110 Vomiting, unspecified: Secondary | ICD-10-CM

## 2017-05-24 DIAGNOSIS — E559 Vitamin D deficiency, unspecified: Secondary | ICD-10-CM | POA: Diagnosis not present

## 2017-05-24 DIAGNOSIS — R7301 Impaired fasting glucose: Secondary | ICD-10-CM | POA: Diagnosis not present

## 2017-05-24 DIAGNOSIS — M1711 Unilateral primary osteoarthritis, right knee: Secondary | ICD-10-CM | POA: Diagnosis not present

## 2017-05-24 DIAGNOSIS — I10 Essential (primary) hypertension: Secondary | ICD-10-CM | POA: Diagnosis not present

## 2017-05-24 NOTE — Progress Notes (Signed)
Name: Yvonne Andrews   MRN: [id number]    DOB: 03-09-1941   Date:05/24/2017       Progress Note  Subjective  Chief Complaint  Chief Complaint  Patient presents with  . Hypertension  . Weight Check  . dyslipidemia    HPI  HTN: Patient has been compliant with medication and denies side effects, no chest pain, no palpitations, headaches, blurred vision, or BLE edema. Seen by Dr. Saunders Revel - cardiologist in May 2018 and EKG showed sinus rhythm, therefore no further testing was done for bradycardia.   Hyperlipidemia: Lipid panel July 2018 was in expected range and CMP  She has been on Crestor , in the past had elevated LFT's with Lipitor, we tried Pravastatin but did not work. No myalgias. Takes 38m ASA daily.  Prediabetes/fasting hyperglycemia: hgbA1C was 5.9% July 2018.  She has changed her diet, cutting down on breads and sweets, but she has "slipped" a little bit over the last few months.  She still drinks 3-4 cups sweet tea.  She denies polydipsia, polyuria or polyphagia. She has gained 4 lbs since last visit - was concerned at her last visit about losing 4lbs - had labs performed which were all reassuring.  OA right knee: she has intermittent pain on right medial knee , and over the right patella when going down stairs. No effusion, no redness. Pain level at this time is zero. She take Tylenol prn very seldom.  Vitamin D deficiency: taking otc supplementation; labs in July 2018 was over 71.  Regurgitation: She notes she occasionally has epigastric pain that doesn't follow a particular pattern.  She denies foul taste.  She does notice that belching sometimes relieves the pain, and she sometimes feels like she needs to belch to clear her esophagus of food.   Allergic Rhinitis: She has AR, worse in the fall and spring.  She has some nasal congestion today, says she normally takes OTC. Reminded her to only take 2nd generation antihistamines like Claritin and to avoid decongestants.  Patient  Active Problem List   Diagnosis Date Noted  . Diverticulosis 03/08/2017  . Vitamin D deficiency 05/29/2016  . Intraocular pressure increase 07/04/2015  . Bilateral tinnitus 07/04/2015  . Benign essential HTN 12/31/2014  . Carpal tunnel syndrome 12/31/2014  . Dyslipidemia 12/31/2014  . Ovarian failure 12/31/2014  . Osteopenia 12/31/2014  . Allergic rhinitis 12/31/2014  . Arthritis of knee, degenerative 12/31/2014  . Acquired trigger finger 12/31/2014  . Cataracts, bilateral 12/31/2014  . Fasting hyperglycemia 12/31/2014  . LBP (low back pain) 05/27/2007    Past Surgical History:  Procedure Laterality Date  . TRIGGER FINGER RELEASE    . TUBAL LIGATION      Family History  Problem Relation Age of Onset  . Heart disease Mother   . CAD Father   . Heart attack Father 831 . Cancer Brother   . Breast cancer Neg Hx     Social History   Social History  . Marital status: Married    Spouse name: N/A  . Number of children: N/A  . Years of education: N/A   Occupational History  . Not on file.   Social History Main Topics  . Smoking status: Never Smoker  . Smokeless tobacco: Never Used  . Alcohol use No  . Drug use: No  . Sexual activity: Not Currently   Other Topics Concern  . Not on file   Social History Narrative   She is married   She has  four grand-children. Two of them live in [place] and two here in town.         Current Outpatient Prescriptions:  .  aspirin EC 81 MG tablet, Take 1 tablet (81 mg total) by mouth daily., Disp: 30 tablet, Rfl: 0 .  Calcium Carbonate 500 MG CHEW, Chew 1 tablet by mouth daily., Disp: , Rfl:  .  Cholecalciferol (VITAMIN D) 2000 UNITS CAPS, Take 1 capsule (2,000 Units total) by mouth daily., Disp: 30 capsule, Rfl: 0 .  losartan (COZAAR) 50 MG tablet, Take 1 tablet (50 mg total) by mouth daily., Disp: 90 tablet, Rfl: 1 .  MULTIPLE VITAMINS-MINERALS ER PO, Take 1 tablet by mouth daily., Disp: , Rfl:  .  Nutritional Supplements  (ESTROVEN PM PO), Take by mouth daily., Disp: , Rfl:  .  rosuvastatin (CRESTOR) 10 MG tablet, Take 1 tablet (10 mg total) by mouth daily., Disp: 90 tablet, Rfl: 2 .  timolol (TIMOPTIC) 0.5 % ophthalmic solution, , Disp: , Rfl:   Allergies  Allergen Reactions  . Lipitor [Atorvastatin] Other (See Comments)    Elevation of LFT's     ROS  Constitutional: Negative for fever or weight change.  Respiratory: Negative for cough and shortness of breath.   Cardiovascular: Negative for chest pain or palpitations.  Gastrointestinal: Negative for abdominal pain, no bowel changes.  Musculoskeletal: Negative for gait problem or joint swelling.  Skin: Negative for rash.  Neurological: Negative for dizziness or headache.  No other specific complaints in a complete review of systems (except as listed in HPI above).  Objective  Vitals:   05/24/17 0841  BP: 130/70  Pulse: 67  Resp: 12  SpO2: 99%  Weight: 142 lb 1.6 oz (64.5 kg)  Height: 5' 5"  (1.651 m)    Body mass index is 23.65 kg/m.  Physical Exam  Constitutional: Patient appears well-developed and well-nourished.  No distress.  HEENT: head atraumatic, normocephalic, pupils equal and reactive to light, no thyromegaly  neck supple, throat within normal limits Cardiovascular: Normal rate, regular rhythm and normal heart sounds.  No murmur heard. No BLE edema. Pulmonary/Chest: Effort normal and breath sounds normal. No respiratory distress. Abdominal: Soft.  There is no tenderness. Psychiatric: Patient has a normal mood and affect. behavior is normal. Judgment and thought content normal.   Recent Results (from the past 2160 hour(s))  POC Hemoccult Bld/Stl (1-Cd Office Dx)     Status: Normal   Collection Time: 03/08/17  1:31 PM  Result Value Ref Range   Card #1 Date 03/08/2017    Fecal Occult Blood, POC Negative Negative  COMPLETE METABOLIC PANEL WITH GFR     Status: Abnormal   Collection Time: 03/13/17 10:18 AM  Result Value Ref  Range   Sodium 141 135 - 146 mmol/L   Potassium 4.8 3.5 - 5.3 mmol/L   Chloride 104 98 - 110 mmol/L   CO2 20 20 - 32 mmol/L    Comment: ** Please note change in reference range(s). **      Glucose, Bld 93 65 - 99 mg/dL   BUN 10 7 - 25 mg/dL   Creat 0.96 (H) 0.60 - 0.93 mg/dL    Comment:   For patients > or = 74 years of age: The upper reference limit for Creatinine is approximately 13% higher for people identified as African-American.      Total Bilirubin 0.5 0.2 - 1.2 mg/dL   Alkaline Phosphatase 81 33 - 130 U/L   AST 24 10 - 35 U/L  ALT 13 6 - 29 U/L   Total Protein 7.1 6.1 - 8.1 g/dL   Albumin 4.6 3.6 - 5.1 g/dL   Calcium 10.6 (H) 8.6 - 10.4 mg/dL   GFR, Est African American 67 >=60 mL/min   GFR, Est Non African American 58 (L) >=60 mL/min  CBC with Differential/Platelet     Status: None   Collection Time: 03/13/17 10:18 AM  Result Value Ref Range   WBC 5.4 3.8 - 10.8 K/uL   RBC 4.69 3.80 - 5.10 MIL/uL   Hemoglobin 13.9 11.7 - 15.5 g/dL   HCT 42.7 35.0 - 45.0 %   MCV 91.0 80.0 - 100.0 fL   MCH 29.6 27.0 - 33.0 pg   MCHC 32.6 32.0 - 36.0 g/dL   RDW 14.7 11.0 - 15.0 %   Platelets 214 140 - 400 K/uL   MPV 10.3 7.5 - 12.5 fL   Neutro Abs 3,024 1,500 - 7,800 cells/uL   Lymphs Abs 1,728 850 - 3,900 cells/uL   Monocytes Absolute 432 200 - 950 cells/uL   Eosinophils Absolute 162 15 - 500 cells/uL   Basophils Absolute 54 0 - 200 cells/uL   Neutrophils Relative % 56 %   Lymphocytes Relative 32 %   Monocytes Relative 8 %   Eosinophils Relative 3 %   Basophils Relative 1 %   Smear Review Criteria for review not met      PHQ2/9: Depression screen [facility] 2/9 01/21/2017 09/20/2016 05/22/2016 11/17/2015 09/19/2015  Decreased Interest 0 0 0 0 0  Down, Depressed, Hopeless 0 0 0 0 0  PHQ - 2 Score 0 0 0 0 0   Fall Risk: Fall Risk  05/24/2017 01/21/2017 09/20/2016 05/22/2016 11/17/2015  Falls in the past year? No No No No No   Functional Status Survey: Is the patient deaf or have  difficulty hearing?: No Does the patient have difficulty seeing, even when wearing glasses/contacts?: No Does the patient have difficulty concentrating, remembering, or making decisions?: No Does the patient have difficulty walking or climbing stairs?: No Does the patient have difficulty doing errands alone such as visiting a doctor's office or shopping?: No  Assessment & Plan  1. Benign essential HTN Continue Losartan, DASH diet discussed  2. Dyslipidemia Continue Crestor and ASA 10m daily.  3. Vitamin D deficiency Continue supplementation  4. Fasting hyperglycemia Lifestyle modifications discussed in detail  5. Primary osteoarthritis of right knee Continue PRN tylenol  6. Seasonal allergic rhinitis, unspecified trigger Continue OTC medication PRN  7. Regurgitation of food PRN Ranitidine or Tums as discussed.   -Reviewed Health Maintenance:

## 2017-05-24 NOTE — Patient Instructions (Signed)
You may try Zantac (Ranitidine) or Tums over the counter for your regurgitation symptoms.

## 2017-06-15 DIAGNOSIS — H409 Unspecified glaucoma: Secondary | ICD-10-CM | POA: Diagnosis not present

## 2017-06-15 DIAGNOSIS — E785 Hyperlipidemia, unspecified: Secondary | ICD-10-CM | POA: Diagnosis not present

## 2017-06-15 DIAGNOSIS — I10 Essential (primary) hypertension: Secondary | ICD-10-CM | POA: Diagnosis not present

## 2017-06-15 DIAGNOSIS — Z6824 Body mass index (BMI) 24.0-24.9, adult: Secondary | ICD-10-CM | POA: Diagnosis not present

## 2017-06-24 DIAGNOSIS — Z6824 Body mass index (BMI) 24.0-24.9, adult: Secondary | ICD-10-CM | POA: Insufficient documentation

## 2017-06-24 DIAGNOSIS — H409 Unspecified glaucoma: Secondary | ICD-10-CM | POA: Insufficient documentation

## 2017-07-03 ENCOUNTER — Other Ambulatory Visit: Payer: Self-pay | Admitting: Family Medicine

## 2017-07-03 DIAGNOSIS — I10 Essential (primary) hypertension: Secondary | ICD-10-CM

## 2017-07-04 NOTE — Telephone Encounter (Signed)
Refill request for Hypertension medication:  Losartan 50 mg  Last office visit pertaining to hypertension: 05/24/2017  Follow up visit: 09/20/2017  BP Readings from Last 3 Encounters:  05/24/17 130/70  03/13/17 140/80  03/08/17 124/86     Lab Results  Component Value Date   CREATININE 0.96 (H) 03/13/2017   BUN 10 03/13/2017   NA 141 03/13/2017   K 4.8 03/13/2017   CL 104 03/13/2017   CO2 20 03/13/2017

## 2017-09-03 ENCOUNTER — Telehealth: Payer: Self-pay | Admitting: Family Medicine

## 2017-09-03 NOTE — Telephone Encounter (Signed)
Visit scheduled °

## 2017-09-06 ENCOUNTER — Ambulatory Visit (INDEPENDENT_AMBULATORY_CARE_PROVIDER_SITE_OTHER): Payer: Medicare PPO

## 2017-09-06 VITALS — BP 140/62 | HR 64 | Temp 97.8°F | Ht 65.0 in | Wt 141.5 lb

## 2017-09-06 DIAGNOSIS — Z0000 Encounter for general adult medical examination without abnormal findings: Secondary | ICD-10-CM | POA: Diagnosis not present

## 2017-09-06 NOTE — Patient Instructions (Signed)
Yvonne Andrews , Thank you for taking time to come for your Medicare Wellness Visit. I appreciate your ongoing commitment to your health goals. Please review the following plan we discussed and let me know if I can assist you in the future.   Screening recommendations/referrals: Colorectal Screening: Completed colonoscopy 01/11/09. Repeat every 10 years Mammogram: Completed 02/01/17. Repeat every year  Bone Density: Completed 01/16/16. Repeat every 10 years Lung Cancer Screening: You do not qualify for this screening Hepatitis C Screening: You do not qualify for this screening HIV/Syphilis/Hepatitis B Screening: You do not qualify for this screening   Vision/Dental Exams: Recommended yearly ophthalmology/optometry visit for glaucoma screening and checkup Recommended yearly dental visit for hygiene and checkup  Vaccinations: Influenza vaccine: Completed 04/23/17 Pneumococcal vaccine: Completed PCV13 09/17/14 and PPSV23 09/20/16 Tdap vaccine: Completed 09/27/10 Shingles vaccine: Completed Zostavax 04/03/12. Please call your insurance company to determine your out of pocket expense for the Shingrix vaccine. You may also receive this vaccine at your local pharmacy or Health Dept.  Advanced directives: .Advance directive discussed with you today. I have provided a copy for you to complete at home and have notarized. Once this is complete please bring a copy in to our office so we can scan it into your chart.  Conditions/risks identified: Recommend to exercise for at least 150 minutes per week.  Next appointment: You are scheduled to see Dr. Carlynn Purl on 09/20/17 @ 7:40am.   Please schedule your Annual Wellness Visit with your Nurse Health Advisor in one year.  Preventive Care 75 Years and Older, Female Preventive care refers to lifestyle choices and visits with your health care provider that can promote health and wellness. What does preventive care include?  A yearly physical exam. This is also called an  annual well check.  Dental exams once or twice a year.  Routine eye exams. Ask your health care provider how often you should have your eyes checked.  Personal lifestyle choices, including:  Daily care of your teeth and gums.  Regular physical activity.  Eating a healthy diet.  Avoiding tobacco and drug use.  Limiting alcohol use.  Practicing safe sex.  Taking low-dose aspirin every day.  Taking vitamin and mineral supplements as recommended by your health care provider. What happens during an annual well check? The services and screenings done by your health care provider during your annual well check will depend on your age, overall health, lifestyle risk factors, and family history of disease. Counseling  Your health care provider may ask you questions about your:  Alcohol use.  Tobacco use.  Drug use.  Emotional well-being.  Home and relationship well-being.  Sexual activity.  Eating habits.  History of falls.  Memory and ability to understand (cognition).  Work and work Astronomer.  Reproductive health. Screening  You may have the following tests or measurements:  Height, weight, and BMI.  Blood pressure.  Lipid and cholesterol levels. These may be checked every 5 years, or more frequently if you are over 6 years old.  Skin check.  Lung cancer screening. You may have this screening every year starting at age 31 if you have a 30-pack-year history of smoking and currently smoke or have quit within the past 15 years.  Fecal occult blood test (FOBT) of the stool. You may have this test every year starting at age 90.  Flexible sigmoidoscopy or colonoscopy. You may have a sigmoidoscopy every 5 years or a colonoscopy every 10 years starting at age 86.  Hepatitis C  blood test.  Hepatitis B blood test.  Sexually transmitted disease (STD) testing.  Diabetes screening. This is done by checking your blood sugar (glucose) after you have not eaten for  a while (fasting). You may have this done every 1-3 years.  Bone density scan. This is done to screen for osteoporosis. You may have this done starting at age 165.  Mammogram. This may be done every 1-2 years. Talk to your health care provider about how often you should have regular mammograms. Talk with your health care provider about your test results, treatment options, and if necessary, the need for more tests. Vaccines  Your health care provider may recommend certain vaccines, such as:  Influenza vaccine. This is recommended every year.  Tetanus, diphtheria, and acellular pertussis (Tdap, Td) vaccine. You may need a Td booster every 10 years.  Zoster vaccine. You may need this after age 75.  Pneumococcal 13-valent conjugate (PCV13) vaccine. One dose is recommended after age 75.  Pneumococcal polysaccharide (PPSV23) vaccine. One dose is recommended after age 75. Talk to your health care provider about which screenings and vaccines you need and how often you need them. This information is not intended to replace advice given to you by your health care provider. Make sure you discuss any questions you have with your health care provider. Document Released: 08/05/2015 Document Revised: 03/28/2016 Document Reviewed: 05/10/2015 Elsevier Interactive Patient Education  2017 [employer]  Fall Prevention in the Home Falls can cause injuries. They can happen to people of all ages. There are many things you can do to make your home safe and to help prevent falls. What can I do on the outside of my home?  Regularly fix the edges of walkways and driveways and fix any cracks.  Remove anything that might make you trip as you walk through a door, such as a raised step or threshold.  Trim any bushes or trees on the path to your home.  Use bright outdoor lighting.  Clear any walking paths of anything that might make someone trip, such as rocks or tools.  Regularly check to see if handrails  are loose or broken. Make sure that both sides of any steps have handrails.  Any raised decks and porches should have guardrails on the edges.  Have any leaves, snow, or ice cleared regularly.  Use sand or salt on walking paths during winter.  Clean up any spills in your garage right away. This includes oil or grease spills. What can I do in the bathroom?  Use night lights.  Install grab bars by the toilet and in the tub and shower. Do not use towel bars as grab bars.  Use non-skid mats or decals in the tub or shower.  If you need to sit down in the shower, use a plastic, non-slip stool.  Keep the floor dry. Clean up any water that spills on the floor as soon as it happens.  Remove soap buildup in the tub or shower regularly.  Attach bath mats securely with double-sided non-slip rug tape.  Do not have throw rugs and other things on the floor that can make you trip. What can I do in the bedroom?  Use night lights.  Make sure that you have a light by your bed that is easy to reach.  Do not use any sheets or blankets that are too big for your bed. They should not hang down onto the floor.  Have a firm chair that has side arms. You  can use this for support while you get dressed.  Do not have throw rugs and other things on the floor that can make you trip. What can I do in the kitchen?  Clean up any spills right away.  Avoid walking on wet floors.  Keep items that you use a lot in easy-to-reach places.  If you need to reach something above you, use a strong step stool that has a grab bar.  Keep electrical cords out of the way.  Do not use floor polish or wax that makes floors slippery. If you must use wax, use non-skid floor wax.  Do not have throw rugs and other things on the floor that can make you trip. What can I do with my stairs?  Do not leave any items on the stairs.  Make sure that there are handrails on both sides of the stairs and use them. Fix handrails  that are broken or loose. Make sure that handrails are as long as the stairways.  Check any carpeting to make sure that it is firmly attached to the stairs. Fix any carpet that is loose or worn.  Avoid having throw rugs at the top or bottom of the stairs. If you do have throw rugs, attach them to the floor with carpet tape.  Make sure that you have a light switch at the top of the stairs and the bottom of the stairs. If you do not have them, ask someone to add them for you. What else can I do to help prevent falls?  Wear shoes that:  Do not have high heels.  Have rubber bottoms.  Are comfortable and fit you well.  Are closed at the toe. Do not wear sandals.  If you use a stepladder:  Make sure that it is fully opened. Do not climb a closed stepladder.  Make sure that both sides of the stepladder are locked into place.  Ask someone to hold it for you, if possible.  Clearly mark and make sure that you can see:  Any grab bars or handrails.  First and last steps.  Where the edge of each step is.  Use tools that help you move around (mobility aids) if they are needed. These include:  Canes.  Walkers.  Scooters.  Crutches.  Turn on the lights when you go into a dark area. Replace any light bulbs as soon as they burn out.  Set up your furniture so you have a clear path. Avoid moving your furniture around.  If any of your floors are uneven, fix them.  If there are any pets around you, be aware of where they are.  Review your medicines with your doctor. Some medicines can make you feel dizzy. This can increase your chance of falling. Ask your doctor what other things that you can do to help prevent falls. This information is not intended to replace advice given to you by your health care provider. Make sure you discuss any questions you have with your health care provider. Document Released: 05/05/2009 Document Revised: 12/15/2015 Document Reviewed: 08/13/2014 Elsevier  Interactive Patient Education  2017 [employer].

## 2017-09-06 NOTE — Progress Notes (Signed)
Subjective:   Yvonne Andrews is a 77 y.o. female who presents for Medicare Annual (Subsequent) preventive examination.  Review of Systems:  N/A Cardiac Risk Factors include: advanced age (>3555men, 37>65 women);diabetes mellitus;dyslipidemia;hypertension     Objective:     Vitals: BP 140/62 (BP Location: Left Arm, Patient Position: Sitting, Cuff Size: Normal)   Pulse 64   Temp 97.8 F (36.6 C) (Oral)   Ht 5\' 5"  (1.651 m)   Wt 141 lb 8 oz (64.2 kg)   SpO2 98%   BMI 23.55 kg/m   Body mass index is 23.55 kg/m.  Advanced Directives 09/06/2017 03/08/2017 01/21/2017 09/20/2016 05/22/2016 11/17/2015 09/19/2015  Does Patient Have a Medical Advance Directive? No No No No No No No  Would patient like information on creating a medical advance directive? Yes (MAU/Ambulatory/Procedural Areas - Information given) - - - No - patient declined information No - patient declined information -   Pt has been provided with the "MOST" and "DNR" documents for her review. Pt has been advised that she will only need to complete the document that is most appropriate to suit her health care wishes. Once completed, pt has been advised to return the appropriate document to the office for the physician to review and sign. Verbalized acceptance and understanding.  Tobacco Social History   Tobacco Use  Smoking Status Never Smoker  Smokeless Tobacco Never Used  Tobacco Comment   smoking cessation materials not required     Counseling given: No Comment: smoking cessation materials not required   Clinical Intake:  Pre-visit preparation completed: Yes  Pain : No/denies pain     BMI - recorded: 23.55 Nutritional Status: BMI of 19-24  Normal Nutritional Risks: None Diabetes: Yes(Prediabetic) CBG done?: No Did pt. bring in CBG monitor from home?: No  How often do you need to have someone help you when you read instructions, pamphlets, or other written materials from your doctor or pharmacy?: 1 -  Never  Interpreter Needed?: No  Information entered by :: AEversole, LPN  Past Medical History:  Diagnosis Date  . Allergy   . Bilateral tinnitus   . Cataracts, bilateral   . Diverticulitis   . Hyperlipidemia   . Hypertension   . Macular degeneration due to cyst or hole, left 2017  . Trigger finger of right hand   . Unspecified glaucoma    Past Surgical History:  Procedure Laterality Date  . TRIGGER FINGER RELEASE    . TUBAL LIGATION     Family History  Problem Relation Age of Onset  . Heart disease Mother   . CAD Father   . Heart attack Father [id number]  . Cancer Brother   . Heart disease Brother   . Breast cancer Neg Hx    Social History   Socioeconomic History  . Marital status: Married    Spouse name: Bethann BerkshireJohnny  . Number of children: 4  . Years of education: None  . Highest education level: 12th grade  Social Needs  . Financial resource strain: Not hard at all  . Food insecurity - worry: Never true  . Food insecurity - inability: Never true  . Transportation needs - medical: No  . Transportation needs - non-medical: No  Occupational History  . Occupation: Retired  Tobacco Use  . Smoking status: Never Smoker  . Smokeless tobacco: Never Used  . Tobacco comment: smoking cessation materials not required  Substance and Sexual Activity  . Alcohol use: No    Alcohol/week: 0.0 oz  .  Drug use: No  . Sexual activity: Not Currently  Other Topics Concern  . None  Social History Narrative       Outpatient Encounter Medications as of 09/06/2017  Medication Sig  . aspirin EC 81 MG tablet Take 1 tablet (81 mg total) by mouth daily.  . Calcium Carbonate 500 MG CHEW Chew 1 tablet by mouth daily.  . Cholecalciferol (VITAMIN D) 2000 UNITS CAPS Take 1 capsule (2,000 Units total) by mouth daily. (Patient taking differently: Take 1 capsule by mouth daily. )  . losartan (COZAAR) 50 MG tablet TAKE 1 TABLET EVERY DAY  . MULTIPLE VITAMINS-MINERALS ER PO Take 1 tablet by mouth  daily.  . Nutritional Supplements (ESTROVEN PM PO) Take by mouth daily.  . rosuvastatin (CRESTOR) 10 MG tablet Take 1 tablet (10 mg total) by mouth daily.  . timolol (TIMOPTIC) 0.5 % ophthalmic solution    No facility-administered encounter medications on file as of 09/06/2017.     Activities of Daily Living In your present state of health, do you have any difficulty performing the following activities: 09/06/2017 05/24/2017  Hearing? Y N  Comment ringing in ears; denies wearing hearing aids -  Vision? N N  Comment wears eyeglasses -  Difficulty concentrating or making decisions? Y N  Comment forgets names on occasion; short term memory loss -  Walking or climbing stairs? N N  Comment pain in R knee -  Dressing or bathing? N -  Doing errands, shopping? N N  Preparing Food and eating ? N -  Comment denies wearing dentures -  Using the Toilet? N -  In the past six months, have you accidently leaked urine? N -  Do you have problems with loss of bowel control? N -  Managing your Medications? N -  Managing your Finances? N -  Housekeeping or managing your Housekeeping? N -  Some recent data might be hidden    Patient Care Team: Alba Cory, MD as PCP - General (Family Medicine)    Assessment:   This is a routine wellness examination for [facility].  Exercise Activities and Dietary recommendations Current Exercise Habits: Home exercise routine, Type of exercise: walking, Time (Minutes): 30, Frequency (Times/Week): 3, Weekly Exercise (Minutes/Week): 90, Intensity: Mild, Exercise limited by: None identified  Goals    . Exercise 150 min/wk Moderate Activity     Recommend to exercise for at least 150 minutes per week.       Fall Risk Fall Risk  09/06/2017 05/24/2017 01/21/2017 09/20/2016 05/22/2016  Falls in the past year? No No No No No   Is the patient's home free of loose throw rugs in walkways, pet beds, electrical cords, etc?   Yes Does the patient have any grab bars in the  bathroom? No  Does the patient use a shower chair when bathing? No Does the patient have any stairs in or around the home? Yes If so, are there any handrails?  Yes Does the patient have adequate lighting?  Yes Does the patient use a cane, walker or w/c? No Does the patient use of an elevated toilet seat? No  Timed Get Up and Go Performed: Yes. Pt ambulated 10 feet within 7 sec. Gait stead-fast and without the use of an assistive device. No intervention required at this time. Fall risk prevention has been discussed.  Pt declined my offer to send Community Resource Referral to Care Guide for installation of grab bars in the shower, shower chair and elevated toilet seat.  Depression  Screen PHQ 2/9 Scores 09/06/2017 01/21/2017 09/20/2016 05/22/2016  PHQ - 2 Score 0 0 0 0     Cognitive Function     6CIT Screen 09/06/2017  What Year? 0 points  What month? 0 points  What time? 0 points  Count back from 20 0 points  Months in reverse 0 points  Repeat phrase 4 points  Total Score 4    Immunization History  Administered Date(s) Administered  . DT 05/27/2007  . Hepatitis B 10/30/2007, 12/03/2007, 05/04/2008  . Influenza, Seasonal, Injecte, Preservative Fre 05/05/2013  . Influenza,inj,Quad PF,6+ Mos 05/04/2015  . Influenza-Unspecified 05/23/2014, 03/17/2016, 04/23/2017  . Pneumococcal Conjugate-13 09/17/2014  . Pneumococcal Polysaccharide-23 05/03/2008, 09/20/2016  . Tdap 09/27/2010  . Zoster 04/03/2012    Qualifies for Shingles Vaccine? Yes. Zostavax completed 04/03/12. Due for Zostavax or Shingrix vaccine. Education has been provided regarding the importance of this vaccine. Pt has been advised to call her insurance company to determine her out of pocket expense. Advised she may also receive this vaccine at her local pharmacy or Health Dept. Verbalized acceptance and understanding.  Screening Tests Health Maintenance  Topic Date Due  . MAMMOGRAM  02/01/2018  . COLONOSCOPY  12/22/2018   . TETANUS/TDAP  09/26/2020  . INFLUENZA VACCINE  Completed  . DEXA SCAN  Completed  . PNA vac Low Risk Adult  Completed    Cancer Screenings: Lung: Low Dose CT Chest recommended if Age 78-80 years, 30 pack-year currently smoking OR have quit w/in 15years. Patient does not qualify. NON-SMOKER Breast:  Up to date on Mammogram? Yes. Completed 02/01/17. Repeat every year  Up to date of Bone Density/Dexa? Yes. Completed 01/16/16. Repeat every 10 years. Colorectal: Completed colonoscopy 01/11/09. Repeat every 10 years  Additional Screenings: Hepatitis B/HIV/Syphillis: Does not qualify Hepatitis C Screening: Does not qualify     Plan:  I have personally reviewed and addressed the Medicare Annual Wellness questionnaire and have noted the following in the patient's chart:  A. Medical and social history B. Use of alcohol, tobacco or illicit drugs  C. Current medications and supplements D. Functional ability and status E.  Nutritional status F.  Physical activity G. Advance directives H. List of other physicians I.  Hospitalizations, surgeries, and ER visits in previous 12 months J.  Vitals K. Screenings such as hearing and vision if needed, cognitive and depression L. Referrals and appointments - none  In addition, I have reviewed and discussed with patient certain preventive protocols, quality metrics, and best practice recommendations. A written personalized care plan for preventive services as well as general preventive health recommendations were provided to patient.  Signed,  Deon Pilling, LPN Nurse Health Advisor  I have reviewed this encounter including the documentation in this note and/or discussed this patient with the provider, Deon Pilling, LPN. I am certifying that I agree with the content of this note as supervising physician.  Alba Cory, MD [facility] 09/06/2017, 4:09 PM

## 2017-09-20 ENCOUNTER — Encounter: Payer: Self-pay | Admitting: Family Medicine

## 2017-09-20 ENCOUNTER — Ambulatory Visit: Payer: Medicare PPO | Admitting: Family Medicine

## 2017-09-20 VITALS — BP 130/72 | HR 58 | Resp 14 | Ht 65.0 in | Wt 142.0 lb

## 2017-09-20 DIAGNOSIS — I10 Essential (primary) hypertension: Secondary | ICD-10-CM | POA: Diagnosis not present

## 2017-09-20 DIAGNOSIS — M8580 Other specified disorders of bone density and structure, unspecified site: Secondary | ICD-10-CM | POA: Diagnosis not present

## 2017-09-20 DIAGNOSIS — E785 Hyperlipidemia, unspecified: Secondary | ICD-10-CM

## 2017-09-20 DIAGNOSIS — E559 Vitamin D deficiency, unspecified: Secondary | ICD-10-CM

## 2017-09-20 DIAGNOSIS — M1711 Unilateral primary osteoarthritis, right knee: Secondary | ICD-10-CM | POA: Diagnosis not present

## 2017-09-20 MED ORDER — ROSUVASTATIN CALCIUM 10 MG PO TABS: 10.0000 mg | ORAL_TABLET | Freq: Every day | ORAL | 1 refills | Status: DC

## 2017-09-20 NOTE — Progress Notes (Signed)
Name: Yvonne Andrews   MRN: [id number]    DOB: Dec 05, 1940   Date:09/20/2017       Progress Note  Subjective  Chief Complaint  Chief Complaint  Patient presents with  . Hypertension  . Hyperlipidemia    HPI  HTN: Patient has been compliant with medication and denies side effects, no chest pain, no palpitations, headaches, blurred vision, or BLE edema. Seen by Dr. Okey DupreEnd - cardiologist in May 2018 and EKG showed sinus rhythm, therefore no further testing was done for bradycardia. Heart is stable and not complaints today.   Hyperlipidemia: Lipid panel July 2018 and at goal, LDL 86. She has been on Crestor , in the past had elevated LFT's with Lipitor, we tried Pravastatin but did not work. No myalgias. Takes 81mg  ASA daily.  Prediabetes/fasting hyperglycemia: hgbA1C was 5.9% July 2018. She tried to avoid sweets. She still likes sweet tea. She denies polyphagia, polydipsia or polyuria   OA right knee: she has intermittent pain on right medial knee , and over the right patella when going down stairs. No effusion, no redness or increase in warmth. Pain level at this time is zero. She take Tylenol prn very seldom.   Vitamin D deficiency: taking otc supplementation; labs in July 2018 was over 71.  Osteopenia: last bone density done 0/12/2015, we will recheck it , discussed therapy  Patient Active Problem List   Diagnosis Date Noted  . Unspecified glaucoma 06/24/2017  . Body mass index (BMI) of 24.0-24.9 in adult 06/24/2017  . Diverticulosis 03/08/2017  . Vitamin D deficiency 05/29/2016  . Intraocular pressure increase 07/04/2015  . Bilateral tinnitus 07/04/2015  . Benign essential HTN 12/31/2014  . Carpal tunnel syndrome 12/31/2014  . Dyslipidemia 12/31/2014  . Ovarian failure 12/31/2014  . Osteopenia 12/31/2014  . Allergic rhinitis 12/31/2014  . Arthritis of knee, degenerative 12/31/2014  . Acquired trigger finger 12/31/2014  . Cataracts, bilateral 12/31/2014  . Fasting  hyperglycemia 12/31/2014  . LBP (low back pain) 05/27/2007    Past Surgical History:  Procedure Laterality Date  . TRIGGER FINGER RELEASE    . TUBAL LIGATION      Family History  Problem Relation Age of Onset  . Heart disease Mother   . CAD Father   . Heart attack Father [id number]  . Cancer Brother   . Heart disease Brother   . Breast cancer Neg Hx     Social History   Socioeconomic History  . Marital status: Married    Spouse name: Bethann BerkshireJohnny  . Number of children: 4  . Years of education: Not on file  . Highest education level: 12th grade  Social Needs  . Financial resource strain: Not hard at all  . Food insecurity - worry: Never true  . Food insecurity - inability: Never true  . Transportation needs - medical: No  . Transportation needs - non-medical: No  Occupational History  . Occupation: Retired  Tobacco Use  . Smoking status: Never Smoker  . Smokeless tobacco: Never Used  . Tobacco comment: smoking cessation materials not required  Substance and Sexual Activity  . Alcohol use: No    Alcohol/week: 0.0 oz  . Drug use: No  . Sexual activity: Not Currently  Other Topics Concern  . Not on file  Social History Narrative        Current Outpatient Medications:  .  aspirin EC 81 MG tablet, Take 1 tablet (81 mg total) by mouth daily., Disp: 30 tablet, Rfl: 0 .  Cholecalciferol (  VITAMIN D) 2000 UNITS CAPS, Take 1 capsule (2,000 Units total) by mouth daily. (Patient taking differently: Take 1 capsule by mouth daily. ), Disp: 30 capsule, Rfl: 0 .  losartan (COZAAR) 50 MG tablet, TAKE 1 TABLET EVERY DAY, Disp: 90 tablet, Rfl: 1 .  MULTIPLE VITAMINS-MINERALS ER PO, Take 1 tablet by mouth daily., Disp: , Rfl:  .  Nutritional Supplements (ESTROVEN PM PO), Take by mouth daily., Disp: , Rfl:  .  rosuvastatin (CRESTOR) 10 MG tablet, Take 1 tablet (10 mg total) by mouth daily., Disp: 90 tablet, Rfl: 1 .  timolol (TIMOPTIC) 0.5 % ophthalmic solution, , Disp: , Rfl:   Allergies   Allergen Reactions  . Lipitor [Atorvastatin] Other (See Comments)    Elevation of LFT's     ROS  Constitutional: Negative for fever or weight change.  Respiratory: Negative for cough and shortness of breath.   Cardiovascular: Negative for chest pain or palpitations.  Gastrointestinal: Negative for abdominal pain, no bowel changes.  Musculoskeletal: Negative for gait problem or joint swelling.  Skin: Negative for rash.  Neurological: Negative for dizziness or headache.  No other specific complaints in a complete review of systems (except as listed in HPI above).  Objective  Vitals:   09/20/17 0751 09/20/17 0812  BP: 140/60 130/72  Pulse: (!) 58   Resp: 14   SpO2: 97%   Weight: 142 lb (64.4 kg)   Height: 5\' 5"  (1.651 m)     Body mass index is 23.63 kg/m.  Physical Exam  Constitutional: Patient appears well-developed and well-nourished. No distress.  HEENT: head atraumatic, normocephalic, pupils equal and reactive to light,neck supple, throat within normal limits Cardiovascular: Normal rate, regular rhythm and normal heart sounds.  No murmur heard. No BLE edema. Pulmonary/Chest: Effort normal and breath sounds normal. No respiratory distress. Abdominal: Soft.  There is no tenderness. Psychiatric: Patient has a normal mood and affect. behavior is normal. Judgment and thought content normal.  PHQ2/9: Depression screen [facility] 2/9 09/06/2017 01/21/2017 09/20/2016 05/22/2016 11/17/2015  Decreased Interest 0 0 0 0 0  Down, Depressed, Hopeless 0 0 0 0 0  PHQ - 2 Score 0 0 0 0 0     Fall Risk: Fall Risk  09/20/2017 09/06/2017 05/24/2017 01/21/2017 09/20/2016  Falls in the past year? No No No No No     Functional Status Survey: Is the patient deaf or have difficulty hearing?: No Does the patient have difficulty seeing, even when wearing glasses/contacts?: No Does the patient have difficulty concentrating, remembering, or making decisions?: No Does the patient have difficulty walking  or climbing stairs?: No Does the patient have difficulty dressing or bathing?: No Does the patient have difficulty doing errands alone such as visiting a doctor's office or shopping?: No    Assessment & Plan  1. Benign essential HTN  Bp is at goal, continue medication   2. Dyslipidemia  Reviewed labs - rosuvastatin (CRESTOR) 10 MG tablet; Take 1 tablet (10 mg total) by mouth daily.  Dispense: 90 tablet; Refill: 1  3. Vitamin D deficiency  Continue supplementation of Vitamin, but back off calcium since calcium level was high, recheck next visit  4. Osteopenia, unspecified location  Recheck bone density   5. Primary osteoarthritis of right knee  Try to walk more, take Tylenol prn

## 2017-10-02 ENCOUNTER — Telehealth: Payer: Self-pay | Admitting: Family Medicine

## 2017-10-02 NOTE — Telephone Encounter (Signed)
Copied from CRM [id number]. Topic: Quick Communication - Rx Refill/Question >> Oct 02, 2017  1:52 PM Alexander BergeronBarksdale, Yvonne B wrote: Pt called b/c her pharmacy informed pt that her Rx has been recalled, contact pt to advise  Medication:  losartan (COZAAR) 50 MG tablet [[id number]][[id number]]

## 2017-10-03 ENCOUNTER — Other Ambulatory Visit: Payer: Self-pay | Admitting: Family Medicine

## 2017-10-03 MED ORDER — VALSARTAN 80 MG PO TABS: 80.0000 mg | ORAL_TABLET | Freq: Every day | ORAL | 0 refills | Status: DC

## 2017-10-03 NOTE — Telephone Encounter (Signed)
Informed patient of new prescription sent to her Mail order and wanted to let us know [employer] told her to throw away the affected bottle of Losartan she had. Then [facility] but advised the patient to call and change medications if possible. Patient will take the new bottle of Losartan until she gets her new prescription and verbalized understanding to come back one week after starting new medication.

## 2017-10-03 NOTE — Telephone Encounter (Signed)
Sent it to both pharmacies, she can chose where she wants to get it from it will be Valsartan 80 mg. She may need a higher dose, please ask her to come in for bp check in one week, nurse visit if possible

## 2017-11-07 DIAGNOSIS — H40053 Ocular hypertension, bilateral: Secondary | ICD-10-CM | POA: Diagnosis not present

## 2017-12-29 ENCOUNTER — Other Ambulatory Visit: Payer: Self-pay | Admitting: Family Medicine

## 2018-01-10 DIAGNOSIS — H409 Unspecified glaucoma: Secondary | ICD-10-CM | POA: Diagnosis not present

## 2018-01-10 DIAGNOSIS — E785 Hyperlipidemia, unspecified: Secondary | ICD-10-CM | POA: Diagnosis not present

## 2018-01-10 DIAGNOSIS — Z6824 Body mass index (BMI) 24.0-24.9, adult: Secondary | ICD-10-CM | POA: Diagnosis not present

## 2018-01-10 DIAGNOSIS — I10 Essential (primary) hypertension: Secondary | ICD-10-CM | POA: Diagnosis not present

## 2018-01-16 ENCOUNTER — Ambulatory Visit (INDEPENDENT_AMBULATORY_CARE_PROVIDER_SITE_OTHER): Payer: Medicare HMO | Admitting: Family Medicine

## 2018-01-16 ENCOUNTER — Encounter: Payer: Self-pay | Admitting: Family Medicine

## 2018-01-16 VITALS — BP 130/70 | HR 60 | Resp 16 | Ht 65.0 in | Wt 139.1 lb

## 2018-01-16 DIAGNOSIS — E559 Vitamin D deficiency, unspecified: Secondary | ICD-10-CM | POA: Diagnosis not present

## 2018-01-16 DIAGNOSIS — E785 Hyperlipidemia, unspecified: Secondary | ICD-10-CM

## 2018-01-16 DIAGNOSIS — M8580 Other specified disorders of bone density and structure, unspecified site: Secondary | ICD-10-CM | POA: Diagnosis not present

## 2018-01-16 DIAGNOSIS — Z1231 Encounter for screening mammogram for malignant neoplasm of breast: Secondary | ICD-10-CM | POA: Diagnosis not present

## 2018-01-16 DIAGNOSIS — R7301 Impaired fasting glucose: Secondary | ICD-10-CM

## 2018-01-16 DIAGNOSIS — E2839 Other primary ovarian failure: Secondary | ICD-10-CM | POA: Diagnosis not present

## 2018-01-16 DIAGNOSIS — I10 Essential (primary) hypertension: Secondary | ICD-10-CM

## 2018-01-16 MED ORDER — ROSUVASTATIN CALCIUM 10 MG PO TABS: 10.0000 mg | ORAL_TABLET | Freq: Every day | ORAL | 1 refills | Status: DC

## 2018-01-16 MED ORDER — VALSARTAN 80 MG PO TABS: 80.0000 mg | ORAL_TABLET | Freq: Every day | ORAL | 0 refills | Status: DC

## 2018-01-16 MED ORDER — VITAMIN D 50 MCG (2000 UT) PO CAPS: 1.0000 | ORAL_CAPSULE | Freq: Every day | ORAL | 0 refills | Status: AC

## 2018-01-16 NOTE — Progress Notes (Signed)
Name: Yvonne Andrews   MRN: [id number]    DOB: [birth year]   Date:01/16/2018       Progress Note  Subjective  Chief Complaint  Chief Complaint  Patient presents with  . Hypertension    HPI   HTN: Patient has been compliant with medication and denies side effects, no chest pain, no palpitations, headaches, blurred vision, or BLE edema. Seen by Dr. Okey Dupre - cardiologist in May 2018 and EKG showed sinus rhythm, therefore no further testing was done for bradycardia. Unchanged. Taking Valsartan and tolerating it well.   Hyperlipidemia:Lipid panel July 2018 and at goal, LDL 86. She has been on Crestor , in the past had elevated LFT's with Lipitor, we tried Pravastatin but was not strong enough.  No myalgias. Takes 81mg  ASA daily. We will recheck labs.   Prediabetes/fasting hyperglycemia: hgbA1C was 5.9% [id number]. She tried to avoid sweets. She still likes sweet tea, but she is trying to cut down on the amount, about 2 glasses a day She denies polyphagia, polydipsia or polyuria   OA right knee: she has intermittent pain on right medial knee , and over the right patella when going down stairs. No effusion, no redness or increase in warmth. Pain level at this time is zero, only has pain with activity.. She take Tylenol prn very seldom.   Vitamin D deficiency: taking otc supplementation; labs in July 2018 was over 71. Continue supplementation, we will not check labs because of cost  Osteopenia: last bone density done 0/12/2015, we will recheck it , discussed therapy She will schedule at the same time of mammogram.    Patient Active Problem List   Diagnosis Date Noted  . Unspecified glaucoma 06/24/2017  . Body mass index (BMI) of 24.0-24.9 in adult 06/24/2017  . Diverticulosis 03/08/2017  . Vitamin D deficiency 05/29/2016  . Intraocular pressure increase 07/04/2015  . Bilateral tinnitus 07/04/2015  . Benign essential HTN 12/31/2014  . Carpal tunnel syndrome 12/31/2014  . Dyslipidemia  12/31/2014  . Ovarian failure 12/31/2014  . Osteopenia 12/31/2014  . Allergic rhinitis 12/31/2014  . Arthritis of knee, degenerative 12/31/2014  . Acquired trigger finger 12/31/2014  . Cataracts, bilateral 12/31/2014  . Fasting hyperglycemia 12/31/2014  . LBP (low back pain) 05/27/2007    Past Surgical History:  Procedure Laterality Date  . TRIGGER FINGER RELEASE    . TUBAL LIGATION      Family History  Problem Relation Age of Onset  . Heart disease Mother   . CAD Father   . Heart attack Father 31  . Cancer Brother   . Heart disease Brother   . Breast cancer Neg Hx     Social History   Socioeconomic History  . Marital status: Married    Spouse name: Bethann Berkshire  . Number of children: 4  . Years of education: Not on file  . Highest education level: 12th grade  Occupational History  . Occupation: Retired  Engineer, production  . Financial resource strain: Not hard at all  . Food insecurity:    Worry: Never true    Inability: Never true  . Transportation needs:    Medical: No    Non-medical: No  Tobacco Use  . Smoking status: Never Smoker  . Smokeless tobacco: Never Used  . Tobacco comment: smoking cessation materials not required  Substance and Sexual Activity  . Alcohol use: No    Alcohol/week: 0.0 oz  . Drug use: No  . Sexual activity: Not Currently  Lifestyle  .  Physical activity:    Days per week: 3 days    Minutes per session: 30 min  . Stress: Not at all  Relationships  . Social connections:    Talks on phone: Not on file    Gets together: Not on file    Attends religious service: Not on file    Active member of club or organization: Not on file    Attends meetings of clubs or organizations: Not on file    Relationship status: Not on file  . Intimate partner violence:    Fear of current or ex partner: No    Emotionally abused: No    Physically abused: No    Forced sexual activity: No  Other Topics Concern  . Not on file  Social History Narrative         Current Outpatient Medications:  .  aspirin EC 81 MG tablet, Take 1 tablet (81 mg total) by mouth daily., Disp: 30 tablet, Rfl: 0 .  Cholecalciferol (VITAMIN D) 2000 units CAPS, Take 1 capsule (2,000 Units total) by mouth daily., Disp: 30 capsule, Rfl: 0 .  MULTIPLE VITAMINS-MINERALS ER PO, Take 1 tablet by mouth daily., Disp: , Rfl:  .  Nutritional Supplements (ESTROVEN PM PO), Take by mouth daily., Disp: , Rfl:  .  rosuvastatin (CRESTOR) 10 MG tablet, Take 1 tablet (10 mg total) by mouth daily., Disp: 90 tablet, Rfl: 1 .  timolol (TIMOPTIC) 0.5 % ophthalmic solution, , Disp: , Rfl:  .  valsartan (DIOVAN) 80 MG tablet, Take 1 tablet (80 mg total) by mouth daily. In place of losartan, Disp: 90 tablet, Rfl: 0  Allergies  Allergen Reactions  . Lipitor [Atorvastatin] Other (See Comments)    Elevation of LFT's     ROS  Constitutional: Negative for fever or weight change.  Respiratory: Negative for cough and shortness of breath.   Cardiovascular: Negative for chest pain or palpitations.  Gastrointestinal: Negative for abdominal pain, no bowel changes.  Musculoskeletal: Negative for gait problem or joint swelling.  Skin: Negative for rash.  Neurological: Negative for dizziness or headache.  No other specific complaints in a complete review of systems (except as listed in HPI above).  Objective  Vitals:   01/16/18 0737  BP: 130/70  Pulse: 60  Resp: 16  SpO2: 99%  Weight: 139 lb 1.6 oz (63.1 kg)  Height: 5\' 5"  (1.651 m)    Body mass index is 23.15 kg/m.  Physical Exam  Constitutional: Patient appears well-developed and well-nourished. No distress.  HEENT: head atraumatic, normocephalic, pupils equal and reactive to light,  neck supple, throat within normal limits Cardiovascular: Normal rate, regular rhythm and normal heart sounds.  No murmur heard. No BLE edema. Pulmonary/Chest: Effort normal and breath sounds normal. No respiratory distress. Abdominal: Soft.  There is  no tenderness. Psychiatric: Patient has a normal mood and affect. behavior is normal. Judgment and thought content normal.  PHQ2/9: Depression screen [facility] 2/9 01/16/2018 09/06/2017 01/21/2017 09/20/2016 05/22/2016  Decreased Interest 0 0 0 0 0  Down, Depressed, Hopeless 0 0 0 0 0  PHQ - 2 Score 0 0 0 0 0     Fall Risk: Fall Risk  01/16/2018 09/20/2017 09/06/2017 05/24/2017 01/21/2017  Falls in the past year? No No No No No     Functional Status Survey: Is the patient deaf or have difficulty hearing?: Yes Does the patient have difficulty seeing, even when wearing glasses/contacts?: No Does the patient have difficulty concentrating, remembering, or making decisions?: Yes  Does the patient have difficulty walking or climbing stairs?: Yes Does the patient have difficulty dressing or bathing?: Yes Does the patient have difficulty doing errands alone such as visiting a doctor's office or shopping?: Yes    Assessment & Plan  1. Dyslipidemia  - Lipid panel - rosuvastatin (CRESTOR) 10 MG tablet; Take 1 tablet (10 mg total) by mouth daily.  Dispense: 90 tablet; Refill: 1  2. Benign essential HTN  - COMPLETE METABOLIC PANEL WITH GFR - valsartan (DIOVAN) 80 MG tablet; Take 1 tablet (80 mg total) by mouth daily. In place of losartan  Dispense: 90 tablet; Refill: 0  3. Vitamin D deficiency  - Cholecalciferol (VITAMIN D) 2000 units CAPS; Take 1 capsule (2,000 Units total) by mouth daily.  Dispense: 30 capsule; Refill: 0 - DG Bone Density  4. Fasting hyperglycemia  - Hemoglobin A1c  5. Osteopenia, unspecified location  - DG Bone Density  6. Ovarian failure  - DG Bone Density  7. Breast cancer screening by mammogram  - MM DIGITAL SCREENING BILATERAL

## 2018-01-17 LAB — LIPID PANEL
CHOL/HDL RATIO: 2.8 (calc) (ref <5.0)
CHOLESTEROL: 183 mg/dL (ref <200)
HDL: 65 mg/dL (ref >50)
LDL Cholesterol (Calc): 98 mg/dL (calc)
Non-HDL Cholesterol (Calc): 118 mg/dL (calc) (ref <130)
Triglycerides: 103 mg/dL (ref <150)

## 2018-01-17 LAB — COMPLETE METABOLIC PANEL WITH GFR
AG RATIO: 1.6 (calc) (ref 1.0–2.5)
ALKALINE PHOSPHATASE (APISO): 75 U/L (ref 33–130)
ALT: 12 U/L (ref 6–29)
AST: 18 U/L (ref 10–35)
Albumin: 4.3 g/dL (ref 3.6–5.1)
BUN: 12 mg/dL (ref 7–25)
CHLORIDE: 106 mmol/L (ref 98–110)
CO2: 28 mmol/L (ref 20–32)
Calcium: 9.6 mg/dL (ref 8.6–10.4)
Creat: 0.87 mg/dL (ref 0.60–0.93)
GFR, Est African American: 76 mL/min/{1.73_m2} (ref >=60)
GFR, Est Non African American: 66 mL/min/{1.73_m2} (ref >=60)
GLOBULIN: 2.7 g/dL (ref 1.9–3.7)
Glucose, Bld: 92 mg/dL (ref 65–99)
POTASSIUM: 4.1 mmol/L (ref 3.5–5.3)
SODIUM: 142 mmol/L (ref 135–146)
Total Bilirubin: 0.7 mg/dL (ref 0.2–1.2)
Total Protein: 7 g/dL (ref 6.1–8.1)

## 2018-01-17 LAB — HEMOGLOBIN A1C
EAG (MMOL/L): 6.6 (calc)
Hgb A1c MFr Bld: 5.8 % of total Hgb — ABNORMAL HIGH (ref <5.7)
MEAN PLASMA GLUCOSE: 120 (calc)

## 2018-02-19 ENCOUNTER — Ambulatory Visit
Admission: RE | Admit: 2018-02-19 | Discharge: 2018-02-19 | Disposition: A | Discharge status: Home / Self Care | Payer: Medicare HMO | Source: Ambulatory Visit | Attending: Family Medicine | Admitting: Family Medicine

## 2018-02-19 DIAGNOSIS — E559 Vitamin D deficiency, unspecified: Secondary | ICD-10-CM | POA: Insufficient documentation

## 2018-02-19 DIAGNOSIS — Z1231 Encounter for screening mammogram for malignant neoplasm of breast: Secondary | ICD-10-CM | POA: Insufficient documentation

## 2018-02-19 DIAGNOSIS — M8580 Other specified disorders of bone density and structure, unspecified site: Secondary | ICD-10-CM | POA: Diagnosis not present

## 2018-02-19 DIAGNOSIS — M8589 Other specified disorders of bone density and structure, multiple sites: Secondary | ICD-10-CM | POA: Diagnosis not present

## 2018-02-19 DIAGNOSIS — E2839 Other primary ovarian failure: Secondary | ICD-10-CM | POA: Insufficient documentation

## 2018-02-19 DIAGNOSIS — Z780 Asymptomatic menopausal state: Secondary | ICD-10-CM | POA: Diagnosis not present

## 2018-03-07 ENCOUNTER — Telehealth: Payer: Self-pay | Admitting: Family Medicine

## 2018-03-07 MED ORDER — ALENDRONATE SODIUM 70 MG PO TABS: 70.0000 mg | ORAL_TABLET | ORAL | 3 refills | Status: DC

## 2018-03-07 NOTE — Telephone Encounter (Signed)
Copied from CRM [id number]. Topic: General - Other >> Mar 07, 2018 10:17 AM Leafy Roobinson, Norma J wrote: Reason for CRM: pt is calling and would [facility]

## 2018-05-19 ENCOUNTER — Ambulatory Visit (INDEPENDENT_AMBULATORY_CARE_PROVIDER_SITE_OTHER): Payer: Medicare HMO | Admitting: Family Medicine

## 2018-05-19 ENCOUNTER — Encounter: Payer: Self-pay | Admitting: Family Medicine

## 2018-05-19 VITALS — BP 122/76 | HR 86 | Temp 98.2°F | Resp 14 | Ht 65.0 in | Wt 135.5 lb

## 2018-05-19 DIAGNOSIS — R7301 Impaired fasting glucose: Secondary | ICD-10-CM | POA: Diagnosis not present

## 2018-05-19 DIAGNOSIS — E559 Vitamin D deficiency, unspecified: Secondary | ICD-10-CM

## 2018-05-19 DIAGNOSIS — M25551 Pain in right hip: Secondary | ICD-10-CM

## 2018-05-19 DIAGNOSIS — D692 Other nonthrombocytopenic purpura: Secondary | ICD-10-CM

## 2018-05-19 DIAGNOSIS — E785 Hyperlipidemia, unspecified: Secondary | ICD-10-CM | POA: Diagnosis not present

## 2018-05-19 DIAGNOSIS — I10 Essential (primary) hypertension: Secondary | ICD-10-CM

## 2018-05-19 DIAGNOSIS — M1711 Unilateral primary osteoarthritis, right knee: Secondary | ICD-10-CM | POA: Diagnosis not present

## 2018-05-19 DIAGNOSIS — M8580 Other specified disorders of bone density and structure, unspecified site: Secondary | ICD-10-CM

## 2018-05-19 MED ORDER — ROSUVASTATIN CALCIUM 10 MG PO TABS: 10.0000 mg | ORAL_TABLET | Freq: Every day | ORAL | 1 refills | Status: DC

## 2018-05-19 MED ORDER — VALSARTAN 80 MG PO TABS: 80.0000 mg | ORAL_TABLET | Freq: Every day | ORAL | 1 refills | Status: DC

## 2018-05-19 NOTE — Progress Notes (Signed)
Name: Yvonne Andrews   MRN: [id number]    DOB: [birth year]   Date:05/19/2018       Progress Note  Subjective  Chief Complaint  Chief Complaint  Patient presents with  . Follow-up    4 month recheck  . Hip Pain    right hip    HPI  HTN: Patient has been compliant with medication and denies side effects, no chest pain, no palpitations, headaches, blurred vision, or BLE edema. She was referred to  Dr. Okey Dupre - cardiologist in May 2018 for bradycardia, but had repeat EKG and was given reassurance.   Hyperlipidemia:Lipid panel June 2019 was at  goal, LDL 98.She has been on Crestor , in the past had elevated LFT's with Lipitor, we tried Pravastatin but was not strong enough.  No myalgias. Takes 81mg  ASA daily.   Prediabetes/fasting hyperglycemia: hgbA1C was 5.8% June 2019.She tried to avoid sweets. She still likes sweet tea a couple of times a day.  She denies polyphagia, polydipsia or polyuria. She is raking leaves as a form of activity at this time.  OA right knee: she has intermittent pain on right medial knee , and over the right patella when going down stairs. No effusion, no redness or increase in warmth.Pain level at this time is zero, only has pain with activity. Not taking medications for the symptoms.   Right outer hip pain: she states she has intermittent symptoms. She states returned yesterday. She states she was standing all day for homecoming event at church. Pain is described as soreness 4/10 at this time, she has not taken any medication for symptoms.   Vitamin D deficiency: taking otc supplementation; labs in July 2018 was over 71. Continue supplementation otc.  Osteopenia: last bone density done 01/2018 and showed mild progression of osteopenia, with FRAX of major fracture at 5% and hip at 1.1%, she is now on Alendronate since July, no side effects of medication.  Senile purpura: she states she bruises easily on arms or legs.   Patient Active Problem List    Diagnosis Date Noted  . Unspecified glaucoma 06/24/2017  . Body mass index (BMI) of 24.0-24.9 in adult 06/24/2017  . Diverticulosis 03/08/2017  . Vitamin D deficiency 05/29/2016  . Intraocular pressure increase 07/04/2015  . Bilateral tinnitus 07/04/2015  . Benign essential HTN 12/31/2014  . Carpal tunnel syndrome 12/31/2014  . Dyslipidemia 12/31/2014  . Ovarian failure 12/31/2014  . Osteopenia 12/31/2014  . Allergic rhinitis 12/31/2014  . Arthritis of knee, degenerative 12/31/2014  . Acquired trigger finger 12/31/2014  . Cataracts, bilateral 12/31/2014  . Fasting hyperglycemia 12/31/2014  . LBP (low back pain) 05/27/2007    Past Surgical History:  Procedure Laterality Date  . TRIGGER FINGER RELEASE    . TUBAL LIGATION      Family History  Problem Relation Age of Onset  . Heart disease Mother   . CAD Father   . Heart attack Father 82  . Cancer Brother   . Heart disease Brother   . Breast cancer Neg Hx     Social History   Socioeconomic History  . Marital status: Married    Spouse name: Bethann Berkshire  . Number of children: 4  . Years of education: Not on file  . Highest education level: 12th grade  Occupational History  . Occupation: Retired  Engineer, production  . Financial resource strain: Not hard at all  . Food insecurity:    Worry: Never true    Inability: Never true  .  Transportation needs:    Medical: No    Non-medical: No  Tobacco Use  . Smoking status: Never Smoker  . Smokeless tobacco: Never Used  . Tobacco comment: smoking cessation materials not required  Substance and Sexual Activity  . Alcohol use: No    Alcohol/week: 0.0 standard drinks  . Drug use: No  . Sexual activity: Not Currently  Lifestyle  . Physical activity:    Days per week: 3 days    Minutes per session: 30 min  . Stress: Not at all  Relationships  . Social connections:    Talks on phone: More than three times a week    Gets together: More than three times a week    Attends religious  service: More than 4 times per year    Active member of club or organization: Yes    Attends meetings of clubs or organizations: More than 4 times per year    Relationship status: Married  . Intimate partner violence:    Fear of current or ex partner: No    Emotionally abused: No    Physically abused: No    Forced sexual activity: No  Other Topics Concern  . Not on file  Social History Narrative        Current Outpatient Medications:  .  alendronate (FOSAMAX) 70 MG tablet, Take 1 tablet (70 mg total) by mouth every 7 (seven) days. Take with a full glass of water on an empty stomach., Disp: 12 tablet, Rfl: 3 .  aspirin EC 81 MG tablet, Take 1 tablet (81 mg total) by mouth daily., Disp: 30 tablet, Rfl: 0 .  Cholecalciferol (VITAMIN D) 2000 units CAPS, Take 1 capsule (2,000 Units total) by mouth daily., Disp: 30 capsule, Rfl: 0 .  MULTIPLE VITAMINS-MINERALS ER PO, Take 1 tablet by mouth daily., Disp: , Rfl:  .  Nutritional Supplements (ESTROVEN PM PO), Take by mouth daily., Disp: , Rfl:  .  rosuvastatin (CRESTOR) 10 MG tablet, Take 1 tablet (10 mg total) by mouth daily., Disp: 90 tablet, Rfl: 1 .  timolol (TIMOPTIC) 0.5 % ophthalmic solution, , Disp: , Rfl:  .  valsartan (DIOVAN) 80 MG tablet, Take 1 tablet (80 mg total) by mouth daily. In place of losartan, Disp: 90 tablet, Rfl: 0  Allergies  Allergen Reactions  . Lipitor [Atorvastatin] Other (See Comments)    Elevation of LFT's    I personally reviewed active problem list, medication list, allergies, family history, social history with the patient/caregiver today.   ROS  Constitutional: Negative for fever or weight change.  Respiratory: Negative for cough and shortness of breath.   Cardiovascular: Negative for chest pain or palpitations.  Gastrointestinal: Negative for abdominal pain, no bowel changes.  Musculoskeletal: Negative for gait problem or joint swelling.  Skin: Negative for rash.  Neurological: Negative for  dizziness or headache.  No other specific complaints in a complete review of systems (except as listed in HPI above).  Objective  Vitals:   05/19/18 0747  BP: 122/76  Pulse: 86  Resp: 14  Temp: 98.2 F (36.8 C)  TempSrc: Oral  SpO2: 99%  Weight: 135 lb 8 oz (61.5 kg)  Height: 5\' 5"  (1.651 m)    Body mass index is 22.55 kg/m.  Physical Exam  Constitutional: Patient appears well-developed and well-nourished. No distress.  HEENT: head atraumatic, normocephalic, pupils equal and reactive to light,  neck supple, throat within normal limits Cardiovascular: Normal rate, regular rhythm and normal heart sounds.  No murmur  heard. No BLE edema. Pulmonary/Chest: Effort normal and breath sounds normal. No respiratory distress. Abdominal: Soft.  There is no tenderness. Skin: no bruises on arms but has on right quad Psychiatric: Patient has a normal mood and affect. behavior is normal. Judgment and thought content normal.  PHQ2/9: Depression screen [facility] 2/9 05/19/2018 01/16/2018 09/06/2017 01/21/2017 09/20/2016  Decreased Interest 0 0 0 0 0  Down, Depressed, Hopeless 0 0 0 0 0  PHQ - 2 Score 0 0 0 0 0  Altered sleeping 0 - - - -  Tired, decreased energy 0 - - - -  Change in appetite 0 - - - -  Feeling bad or failure about yourself  0 - - - -  Trouble concentrating 0 - - - -  Moving slowly or fidgety/restless 0 - - - -  Suicidal thoughts 0 - - - -  PHQ-9 Score 0 - - - -  Difficult doing work/chores Not difficult at all - - - -     Fall Risk: Fall Risk  05/19/2018 01/16/2018 09/20/2017 09/06/2017 05/24/2017  Falls in the past year? No No No No No     Assessment & Plan  1. Dyslipidemia  - rosuvastatin (CRESTOR) 10 MG tablet; Take 1 tablet (10 mg total) by mouth daily.  Dispense: 90 tablet; Refill: 1  2. Benign essential HTN  - valsartan (DIOVAN) 80 MG tablet; Take 1 tablet (80 mg total) by mouth daily. In place of losartan  Dispense: 90 tablet; Refill: 1   3. Vitamin D  deficiency   4. Senile purpura (HCC)  reassurance  5. Osteopenia, unspecified location   6. Fasting hyperglycemia   7. Primary osteoarthritis of right knee  Discussed quad exercises   8. Right hip pain  Discussed foal rolls

## 2018-05-19 NOTE — Patient Instructions (Signed)
[facility] Ask your health care provider which exercises are safe for you. Do exercises exactly as told by your health care provider and adjust them as directed. It is normal to feel mild stretching, pulling, tightness, or discomfort as you do these exercises, but you should stop right away if you feel sudden pain or your pain gets worse.Do not begin these exercises until told by your health care provider. Stretching and range of motion exercises These exercises warm up your muscles and joints and improve the movement and flexibility of your knee. These exercises also help to relieve pain, swelling, numbness, and tingling. Exercise A: Quadriceps, prone  1. Lie on your abdomen on a firm surface, such as a bed or padded floor. 2. Bend your left / right knee and hold your ankle. If you cannot reach your ankle or pant leg, loop a belt around your foot and grab the belt instead. 3. Gently pull your heel toward your buttocks. Your knee should not slide out to the side. You should feel a stretch in the front of your thigh and knee. 4. Hold this position for __________ seconds. Repeat __________ times. Complete this stretch __________ times a day. Exercise B: Superior patellar mobilization 1. Sit with your legs out in front of you. 2. Place your fingertips on the bottom border of your left / right kneecap. Keep your thigh muscles relaxed. 3. Gently pull toward you so your kneecap slides up toward your hip. 4. Hold this position for __________ seconds. Repeat __________ times. Complete this stretch __________ times a day. Exercise C: Standing lunge ( hip flexors) 1. Stand with your left / right foot 2-3 ft (0.6-1 m) in front of your other foot. 2. Keeping good posture with your head over your shoulders, tuck your tailbone underneath you and shift your hips forward. You should feel a gentle stretch in the front of your back thigh and hip. 3. Hold this position for __________ seconds. Repeat  __________ times. Complete this stretch __________ times a day. Strengthening exercises These exercises build strength and endurance in your knee. Endurance is the ability to use your muscles for a long time, even after they get tired. Exercise D: Wall slides ( quadriceps) 1. Lean your back against a smooth wall or door while you walk your feet out 18-24 inches (46-61 cm) from it. 2. Place your feet hip-width apart. 3. Slowly slide down the wall or door until your knees bend __________ degrees. Keep your knees over your heels, not your toes. Keep your knees in line with your hips. 4. Hold for __________ seconds. 5. Push through your heels to stand up to rest for __________ seconds after each repetition. Repeat __________ times. Complete this exercise __________ times a day. Exercise E: Straight leg raises ( hip abductors) 1. Lie on your side with your left / right leg in the top position. Lie so your head, shoulder, knee, and hip line up. You may bend your bottom knee to help you keep your balance. 2. Roll your hips slightly forward so your hips are stacked directly over each other and your left / right knee is facing forward. 3. Leading with your heel, lift your top leg 4-6 inches (10-15 cm). You should feel the muscles in your outer hip lifting. ? Do not let your foot drift forward. ? Do not let your knee roll toward the ceiling. 4. Hold this position for __________ seconds. 5. Slowly lower your leg to the starting position. 6. Allow your   muscles to relax completely. Repeat __________ times. Complete this exercise __________ times a day. This information is not intended to replace advice given to you by your health care provider. Make sure you discuss any questions you have with your health care provider. Document Released: 07/09/2005 Document Revised: 03/15/2016 Document Reviewed: 05/21/2015 Elsevier Interactive Patient Education  2018 [employer]  

## 2018-06-05 DIAGNOSIS — H40053 Ocular hypertension, bilateral: Secondary | ICD-10-CM | POA: Diagnosis not present

## 2018-09-09 ENCOUNTER — Ambulatory Visit (INDEPENDENT_AMBULATORY_CARE_PROVIDER_SITE_OTHER): Payer: Medicare HMO

## 2018-09-09 ENCOUNTER — Encounter: Payer: Self-pay | Admitting: Family Medicine

## 2018-09-09 ENCOUNTER — Ambulatory Visit (INDEPENDENT_AMBULATORY_CARE_PROVIDER_SITE_OTHER): Payer: Medicare HMO | Admitting: Family Medicine

## 2018-09-09 VITALS — BP 122/72 | HR 54 | Temp 97.6°F | Resp 16 | Ht 65.0 in | Wt 138.1 lb

## 2018-09-09 VITALS — BP 122/72 | HR 54 | Temp 97.6°F | Resp 15 | Ht 65.0 in | Wt 138.1 lb

## 2018-09-09 DIAGNOSIS — R7301 Impaired fasting glucose: Secondary | ICD-10-CM

## 2018-09-09 DIAGNOSIS — E785 Hyperlipidemia, unspecified: Secondary | ICD-10-CM

## 2018-09-09 DIAGNOSIS — I10 Essential (primary) hypertension: Secondary | ICD-10-CM | POA: Diagnosis not present

## 2018-09-09 DIAGNOSIS — Z0000 Encounter for general adult medical examination without abnormal findings: Secondary | ICD-10-CM

## 2018-09-09 DIAGNOSIS — E559 Vitamin D deficiency, unspecified: Secondary | ICD-10-CM

## 2018-09-09 DIAGNOSIS — D692 Other nonthrombocytopenic purpura: Secondary | ICD-10-CM

## 2018-09-09 DIAGNOSIS — M8580 Other specified disorders of bone density and structure, unspecified site: Secondary | ICD-10-CM | POA: Diagnosis not present

## 2018-09-09 DIAGNOSIS — Z1211 Encounter for screening for malignant neoplasm of colon: Secondary | ICD-10-CM

## 2018-09-09 DIAGNOSIS — Z1231 Encounter for screening mammogram for malignant neoplasm of breast: Secondary | ICD-10-CM | POA: Diagnosis not present

## 2018-09-09 LAB — COMPLETE METABOLIC PANEL WITH GFR
AG RATIO: 1.7 (calc) (ref 1.0–2.5)
ALKALINE PHOSPHATASE (APISO): 56 U/L (ref 37–153)
ALT: 13 U/L (ref 6–29)
AST: 19 U/L (ref 10–35)
Albumin: 4.3 g/dL (ref 3.6–5.1)
BILIRUBIN TOTAL: 0.6 mg/dL (ref 0.2–1.2)
BUN: 9 mg/dL (ref 7–25)
CHLORIDE: 107 mmol/L (ref 98–110)
CO2: 28 mmol/L (ref 20–32)
Calcium: 9.7 mg/dL (ref 8.6–10.4)
Creat: 0.83 mg/dL (ref 0.60–0.93)
GFR, Est African American: 80 mL/min/{1.73_m2} (ref >=60)
GFR, Est Non African American: 69 mL/min/{1.73_m2} (ref >=60)
GLUCOSE: 92 mg/dL (ref 65–99)
Globulin: 2.6 g/dL (calc) (ref 1.9–3.7)
POTASSIUM: 4.8 mmol/L (ref 3.5–5.3)
SODIUM: 141 mmol/L (ref 135–146)
Total Protein: 6.9 g/dL (ref 6.1–8.1)

## 2018-09-09 NOTE — Patient Instructions (Signed)
Yvonne Andrews , Thank you for taking time to come for your Medicare Wellness Visit. I appreciate your ongoing commitment to your health goals. Please review the following plan we discussed and let me know if I can assist you in the future.   Screening recommendations/referrals: Colonoscopy: done 12/21/2008. Referral placed to gastroenterology for repeat screening.  Mammogram: done 02/19/18. Please call [phone] to schedule your mammogram.  Bone Density: done 02/19/18 Recommended yearly ophthalmology/optometry visit for glaucoma screening and checkup Recommended yearly dental visit for hygiene and checkup  Vaccinations: Influenza vaccine: done 04/04/18 Pneumococcal vaccine: done 09/20/16 Tdap vaccine: done 09/27/10 Shingles vaccine: Shingrix discussed. Please contact your pharmacy for coverage information.   Advanced directives: Please bring a copy of your health care power of attorney and living will to the office at your convenience.  Conditions/risks identified: Recommend increasing physical activity to 150 minutes per week.   Next appointment: Please follow up in one year for your Medicare Annual Wellness visit.    Preventive Care 50 Years and Older, Female Preventive care refers to lifestyle choices and visits with your health care provider that can promote health and wellness. What does preventive care include?  A yearly physical exam. This is also called an annual well check.  Dental exams once or twice a year.  Routine eye exams. Ask your health care provider how often you should have your eyes checked.  Personal lifestyle choices, including:  Daily care of your teeth and gums.  Regular physical activity.  Eating a healthy diet.  Avoiding tobacco and drug use.  Limiting alcohol use.  Practicing safe sex.  Taking low-dose aspirin every day.  Taking vitamin and mineral supplements as recommended by your health care provider. What happens during an annual well check? The  services and screenings done by your health care provider during your annual well check will depend on your age, overall health, lifestyle risk factors, and family history of disease. Counseling  Your health care provider may ask you questions about your:  Alcohol use.  Tobacco use.  Drug use.  Emotional well-being.  Home and relationship well-being.  Sexual activity.  Eating habits.  History of falls.  Memory and ability to understand (cognition).  Work and work Astronomer.  Reproductive health. Screening  You may have the following tests or measurements:  Height, weight, and BMI.  Blood pressure.  Lipid and cholesterol levels. These may be checked every 5 years, or more frequently if you are over 79 years old.  Skin check.  Lung cancer screening. You may have this screening every year starting at age 84 if you have a 30-pack-year history of smoking and currently smoke or have quit within the past 15 years.  Fecal occult blood test (FOBT) of the stool. You may have this test every year starting at age 15.  Flexible sigmoidoscopy or colonoscopy. You may have a sigmoidoscopy every 5 years or a colonoscopy every 10 years starting at age 52.  Hepatitis C blood test.  Hepatitis B blood test.  Sexually transmitted disease (STD) testing.  Diabetes screening. This is done by checking your blood sugar (glucose) after you have not eaten for a while (fasting). You may have this done every 1-3 years.  Bone density scan. This is done to screen for osteoporosis. You may have this done starting at age 65.  Mammogram. This may be done every 1-2 years. Talk to your health care provider about how often you should have regular mammograms. Talk with your health care provider  about your test results, treatment options, and if necessary, the need for more tests. Vaccines  Your health care provider may recommend certain vaccines, such as:  Influenza vaccine. This is recommended  every year.  Tetanus, diphtheria, and acellular pertussis (Tdap, Td) vaccine. You may need a Td booster every 10 years.  Zoster vaccine. You may need this after age 100.  Pneumococcal 13-valent conjugate (PCV13) vaccine. One dose is recommended after age 58.  Pneumococcal polysaccharide (PPSV23) vaccine. One dose is recommended after age 51. Talk to your health care provider about which screenings and vaccines you need and how often you need them. This information is not intended to replace advice given to you by your health care provider. Make sure you discuss any questions you have with your health care provider. Document Released: 08/05/2015 Document Revised: 03/28/2016 Document Reviewed: 05/10/2015 Elsevier Interactive Patient Education  2017 [employer] Prevention in the Home Falls can cause injuries. They can happen to people of all ages. There are many things you can do to make your home safe and to help prevent falls. What can I do on the outside of my home?  Regularly fix the edges of walkways and driveways and fix any cracks.  Remove anything that might make you trip as you walk through a door, such as a raised step or threshold.  Trim any bushes or trees on the path to your home.  Use bright outdoor lighting.  Clear any walking paths of anything that might make someone trip, such as rocks or tools.  Regularly check to see if handrails are loose or broken. Make sure that both sides of any steps have handrails.  Any raised decks and porches should have guardrails on the edges.  Have any leaves, snow, or ice cleared regularly.  Use sand or salt on walking paths during winter.  Clean up any spills in your garage right away. This includes oil or grease spills. What can I do in the bathroom?  Use night lights.  Install grab bars by the toilet and in the tub and shower. Do not use towel bars as grab bars.  Use non-skid mats or decals in the tub or shower.  If  you need to sit down in the shower, use a plastic, non-slip stool.  Keep the floor dry. Clean up any water that spills on the floor as soon as it happens.  Remove soap buildup in the tub or shower regularly.  Attach bath mats securely with double-sided non-slip rug tape.  Do not have throw rugs and other things on the floor that can make you trip. What can I do in the bedroom?  Use night lights.  Make sure that you have a light by your bed that is easy to reach.  Do not use any sheets or blankets that are too big for your bed. They should not hang down onto the floor.  Have a firm chair that has side arms. You can use this for support while you get dressed.  Do not have throw rugs and other things on the floor that can make you trip. What can I do in the kitchen?  Clean up any spills right away.  Avoid walking on wet floors.  Keep items that you use a lot in easy-to-reach places.  If you need to reach something above you, use a strong step stool that has a grab bar.  Keep electrical cords out of the way.  Do not use floor polish or  wax that makes floors slippery. If you must use wax, use non-skid floor wax.  Do not have throw rugs and other things on the floor that can make you trip. What can I do with my stairs?  Do not leave any items on the stairs.  Make sure that there are handrails on both sides of the stairs and use them. Fix handrails that are broken or loose. Make sure that handrails are as long as the stairways.  Check any carpeting to make sure that it is firmly attached to the stairs. Fix any carpet that is loose or worn.  Avoid having throw rugs at the top or bottom of the stairs. If you do have throw rugs, attach them to the floor with carpet tape.  Make sure that you have a light switch at the top of the stairs and the bottom of the stairs. If you do not have them, ask someone to add them for you. What else can I do to help prevent falls?  Wear shoes  that:  Do not have high heels.  Have rubber bottoms.  Are comfortable and fit you well.  Are closed at the toe. Do not wear sandals.  If you use a stepladder:  Make sure that it is fully opened. Do not climb a closed stepladder.  Make sure that both sides of the stepladder are locked into place.  Ask someone to hold it for you, if possible.  Clearly mark and make sure that you can see:  Any grab bars or handrails.  First and last steps.  Where the edge of each step is.  Use tools that help you move around (mobility aids) if they are needed. These include:  Canes.  Walkers.  Scooters.  Crutches.  Turn on the lights when you go into a dark area. Replace any light bulbs as soon as they burn out.  Set up your furniture so you have a clear path. Avoid moving your furniture around.  If any of your floors are uneven, fix them.  If there are any pets around you, be aware of where they are.  Review your medicines with your doctor. Some medicines can make you feel dizzy. This can increase your chance of falling. Ask your doctor what other things that you can do to help prevent falls. This information is not intended to replace advice given to you by your health care provider. Make sure you discuss any questions you have with your health care provider. Document Released: 05/05/2009 Document Revised: 12/15/2015 Document Reviewed: 08/13/2014 Elsevier Interactive Patient Education  2017 [employer].

## 2018-09-09 NOTE — Progress Notes (Signed)
Name: Yvonne Andrews   MRN: [id number]    DOB: [birth year]   Date:09/09/2018       Progress Note  Subjective  Chief Complaint  Chief Complaint  Patient presents with  . Medication Refill  . Hypertension  . Hyperlipidemia  . OA right knee  . Osteopenia  . Vitamin D deficiency    HPI  HTN: Patient has been compliant with medication and denies side effects, no chest pain, no palpitations, headaches, dizziness , or BLE edema. She was referred to  Dr. Okey Dupre - cardiologist in May 2018 for bradycardia, but had repeat EKG and was given reassurance.    Hyperlipidemia:Lipid panel June 2019 was at  goal, LDL 98.She has been on Crestor , in the past had elevated LFT's with Lipitor, we tried Pravastatin butwas not strong enough.No myalgias. Takes 81mg  ASA daily. We will recheck comp panel because of previous history of elevation of liver enzymes  Prediabetes/fasting hyperglycemia: hgbA1C was 5.8% June 2019.She tried to avoid sweets. She still likes sweet tea a couple of times a day. She denies polyphagia, polydipsia or polyuria. Unchanged.   OA right knee: she has intermittent pain on right medial knee , and over the right patella when going down stairs. No effusion, no redness or increase in warmth.Pain level at this time is zero, using when going down steps now   Right outer hip pain: she states she has intermittent symptoms. Doing well now, very mild now, states depending on level of activity.   Vitamin D deficiency: taking otc supplementation, continue supplementation   Osteopenia: last bone density done 01/2018 and showed mild progression of osteopenia, with FRAX of major fracture at 5% and hip at 1.1%, she is now on Alendronate since July, no side effects of medication. Unchanged   Senile purpura: she states she bruises easily on arms or legs. Unchanged   Stress: husband diagnosed with lung cancer Fall 2019, he had some pneumonias and off treatment now, she has great support  from neighbors , daughter and church family.   Patient Active Problem List   Diagnosis Date Noted  . Unspecified glaucoma 06/24/2017  . Body mass index (BMI) of 24.0-24.9 in adult 06/24/2017  . Diverticulosis 03/08/2017  . Vitamin D deficiency 05/29/2016  . Intraocular pressure increase 07/04/2015  . Bilateral tinnitus 07/04/2015  . Benign essential HTN 12/31/2014  . Carpal tunnel syndrome 12/31/2014  . Dyslipidemia 12/31/2014  . Ovarian failure 12/31/2014  . Osteopenia 12/31/2014  . Allergic rhinitis 12/31/2014  . Arthritis of knee, degenerative 12/31/2014  . Acquired trigger finger 12/31/2014  . Cataracts, bilateral 12/31/2014  . Fasting hyperglycemia 12/31/2014  . LBP (low back pain) 05/27/2007    Past Surgical History:  Procedure Laterality Date  . TRIGGER FINGER RELEASE    . TUBAL LIGATION      Family History  Problem Relation Age of Onset  . Heart disease Mother   . CAD Father   . Heart attack Father 33  . Cancer Brother   . Heart disease Brother   . Breast cancer Neg Hx     Social History   Socioeconomic History  . Marital status: Married    Spouse name: Bethann Berkshire  . Number of children: 4  . Years of education: Not on file  . Highest education level: 12th grade  Occupational History  . Occupation: Retired  Engineer, production  . Financial resource strain: Not hard at all  . Food insecurity:    Worry: Never true    Inability:  Never true  . Transportation needs:    Medical: No    Non-medical: No  Tobacco Use  . Smoking status: Never Smoker  . Smokeless tobacco: Never Used  . Tobacco comment: smoking cessation materials not required  Substance and Sexual Activity  . Alcohol use: No    Alcohol/week: 0.0 standard drinks  . Drug use: No  . Sexual activity: Not Currently  Lifestyle  . Physical activity:    Days per week: 0 days    Minutes per session: Not on file  . Stress: Not at all  Relationships  . Social connections:    Talks on phone: More than  three times a week    Gets together: More than three times a week    Attends religious service: More than 4 times per year    Active member of club or organization: Yes    Attends meetings of clubs or organizations: More than 4 times per year    Relationship status: Married  . Intimate partner violence:    Fear of current or ex partner: No    Emotionally abused: No    Physically abused: No    Forced sexual activity: No  Other Topics Concern  . Not on file  Social History Narrative        Current Outpatient Medications:  .  alendronate (FOSAMAX) 70 MG tablet, Take 1 tablet (70 mg total) by mouth every 7 (seven) days. Take with a full glass of water on an empty stomach., Disp: 12 tablet, Rfl: 3 .  aspirin EC 81 MG tablet, Take 1 tablet (81 mg total) by mouth daily., Disp: 30 tablet, Rfl: 0 .  Cholecalciferol (VITAMIN D) 2000 units CAPS, Take 1 capsule (2,000 Units total) by mouth daily., Disp: 30 capsule, Rfl: 0 .  MULTIPLE VITAMINS-MINERALS ER PO, Take 1 tablet by mouth daily., Disp: , Rfl:  .  Nutritional Supplements (ESTROVEN PM PO), Take by mouth daily., Disp: , Rfl:  .  rosuvastatin (CRESTOR) 10 MG tablet, Take 1 tablet (10 mg total) by mouth daily., Disp: 90 tablet, Rfl: 1 .  timolol (TIMOPTIC) 0.5 % ophthalmic solution, , Disp: , Rfl:  .  valsartan (DIOVAN) 80 MG tablet, Take 1 tablet (80 mg total) by mouth daily. In place of losartan, Disp: 90 tablet, Rfl: 1  Allergies  Allergen Reactions  . Lipitor [Atorvastatin] Other (See Comments)    Elevation of LFT's    I personally reviewed active problem list, medication list, allergies, family history, social history with the patient/caregiver today.   ROS  Constitutional: Negative for fever or weight change.  Respiratory: Negative for cough and shortness of breath.   Cardiovascular: Negative for chest pain or palpitations.  Gastrointestinal: Negative for abdominal pain, no bowel changes.  Musculoskeletal: Negative for gait  problem or joint swelling.  Skin: Negative for rash.  Neurological: Negative for dizziness or headache.  No other specific complaints in a complete review of systems (except as listed in HPI above).  Objective  Vitals:   09/09/18 0927  BP: 122/72  Pulse: (!) 54  Resp: 16  Temp: 97.6 F (36.4 C)  TempSrc: Oral  SpO2: 98%  Weight: 138 lb 1.6 oz (62.6 kg)  Height: 5\' 5"  (1.651 m)    Body mass index is 22.98 kg/m.  Physical Exam  Constitutional: Patient appears well-developed and well-nourished. No distress.  HEENT: head atraumatic, normocephalic, pupils equal and reactive to light, neck supple, throat within normal limits Cardiovascular: Normal rate, regular rhythm and normal  heart sounds.  No murmur heard. No BLE edema. Pulmonary/Chest: Effort normal and breath sounds normal. No respiratory distress. Abdominal: Soft.  There is no tenderness. Psychiatric: Patient has a normal mood and affect. behavior is normal. Judgment and thought content normal.  PHQ2/9: Depression screen [facility] 2/[facility] 09/09/2018 05/19/2018 01/16/2018 09/06/2017 01/21/2017  Decreased Interest 0 0 0 0 0  Down, Depressed, Hopeless 0 0 0 0 0  PHQ - 2 Score 0 0 0 0 0  Altered sleeping - 0 - - -  Tired, decreased energy - 0 - - -  Change in appetite - 0 - - -  Feeling bad or failure about yourself  - 0 - - -  Trouble concentrating - 0 - - -  Moving slowly or fidgety/restless - 0 - - -  Suicidal thoughts - 0 - - -  PHQ-9 Score - 0 - - -  Difficult doing work/chores - Not difficult at all - - -     Fall Risk: Fall Risk  09/09/2018 05/19/2018 01/16/2018 09/20/2017 09/06/2017  Falls in the past year? 0 No No No No  Number falls in past yr: 0 - - - -  Injury with Fall? 0 - - - -  Follow up Falls prevention discussed - - - -     Assessment & Plan  1. Dyslipidemia  - COMPLETE METABOLIC PANEL WITH GFR  2. Benign essential HTN  - COMPLETE METABOLIC PANEL WITH GFR  3. Vitamin D deficiency  Taking  supplementation  4. Senile purpura (HCC)  Stable  5. Osteopenia, unspecified location  Taking fosamax  6. Fasting hyperglycemia  Recheck labs next visit

## 2018-09-09 NOTE — Progress Notes (Signed)
Subjective:   Yvonne Andrews is a 78 y.o. female who presents for Medicare Annual (Subsequent) preventive examination.  Review of Systems:   Cardiac Risk Factors include: advanced age (>69men, >68 women);dyslipidemia;hypertension     Objective:     Vitals: BP 122/72 (BP Location: Right Arm, Patient Position: Sitting, Cuff Size: Normal)   Pulse (!) 54   Temp 97.6 F (36.4 C) (Oral)   Resp 15   Ht 5\' 5"  (1.651 m)   Wt 138 lb 1.6 oz (62.6 kg)   SpO2 98%   BMI 22.98 kg/m   Body mass index is 22.98 kg/m.  Advanced Directives 09/09/2018 09/06/2017 03/08/2017 01/21/2017 09/20/2016 05/22/2016 11/17/2015  Does Patient Have a Medical Advance Directive? Yes No No No No No No  Type of Advance Directive Living will;Healthcare Power of Attorney - - - - - -  Copy of Healthcare Power of Attorney in Chart? No - copy requested - - - - - -  Would patient like information on creating a medical advance directive? - Yes (MAU/Ambulatory/Procedural Areas - Information given) - - - No - patient declined information No - patient declined information    Tobacco Social History   Tobacco Use  Smoking Status Never Smoker  Smokeless Tobacco Never Used  Tobacco Comment   smoking cessation materials not required     Counseling given: Not Answered Comment: smoking cessation materials not required   Clinical Intake:  Pre-visit preparation completed: Yes  Pain : 0-10 Pain Score: 2  Pain Type: Chronic pain Pain Location: Hip(knee) Pain Orientation: Right Pain Descriptors / Indicators: Aching Pain Onset: More than a month ago Pain Frequency: Intermittent(increases with activity)     BMI - recorded: 22.98 Nutritional Status: BMI of 19-24  Normal Nutritional Risks: None Diabetes: No  How often do you need to have someone help you when you read instructions, pamphlets, or other written materials from your doctor or pharmacy?: 1 - Never What is the last grade level you completed in school?: 12th  grade  Interpreter Needed?: No  Information entered by :: Reather Littler LPN  Past Medical History:  Diagnosis Date  . Allergy   . Bilateral tinnitus   . Cataracts, bilateral   . Diverticulitis   . Hyperlipidemia   . Hypertension   . Macular degeneration due to cyst or hole, left 2017  . Osteopenia   . Trigger finger of right hand   . Unspecified glaucoma    Past Surgical History:  Procedure Laterality Date  . TRIGGER FINGER RELEASE    . TUBAL LIGATION     Family History  Problem Relation Age of Onset  . Heart disease Mother   . CAD Father   . Heart attack Father 72  . Cancer Brother   . Heart disease Brother   . Breast cancer Neg Hx    Social History   Socioeconomic History  . Marital status: Married    Spouse name: Bethann Berkshire  . Number of children: 4  . Years of education: Not on file  . Highest education level: 12th grade  Occupational History  . Occupation: Retired  Engineer, production  . Financial resource strain: Not hard at all  . Food insecurity:    Worry: Never true    Inability: Never true  . Transportation needs:    Medical: No    Non-medical: No  Tobacco Use  . Smoking status: Never Smoker  . Smokeless tobacco: Never Used  . Tobacco comment: smoking cessation materials not required  Substance and Sexual Activity  . Alcohol use: No    Alcohol/week: 0.0 standard drinks  . Drug use: No  . Sexual activity: Not Currently  Lifestyle  . Physical activity:    Days per week: 0 days    Minutes per session: Not on file  . Stress: Not at all  Relationships  . Social connections:    Talks on phone: More than three times a week    Gets together: More than three times a week    Attends religious service: More than 4 times per year    Active member of club or organization: Yes    Attends meetings of clubs or organizations: More than 4 times per year    Relationship status: Married  Other Topics Concern  . Not on file  Social History Narrative        Outpatient Encounter Medications as of 09/09/2018  Medication Sig  . alendronate (FOSAMAX) 70 MG tablet Take 1 tablet (70 mg total) by mouth every 7 (seven) days. Take with a full glass of water on an empty stomach.  Marland Kitchen aspirin EC 81 MG tablet Take 1 tablet (81 mg total) by mouth daily.  . Cholecalciferol (VITAMIN D) 2000 units CAPS Take 1 capsule (2,000 Units total) by mouth daily.  . MULTIPLE VITAMINS-MINERALS ER PO Take 1 tablet by mouth daily.  . rosuvastatin (CRESTOR) 10 MG tablet Take 1 tablet (10 mg total) by mouth daily.  . timolol (TIMOPTIC) 0.5 % ophthalmic solution   . valsartan (DIOVAN) 80 MG tablet Take 1 tablet (80 mg total) by mouth daily. In place of losartan  . Nutritional Supplements (ESTROVEN PM PO) Take by mouth daily.   No facility-administered encounter medications on file as of 09/09/2018.     Activities of Daily Living In your present state of health, do you have any difficulty performing the following activities: 09/09/2018 01/16/2018  Hearing? N Y  Comment declines hearing aids -  Vision? N N  Comment wears glasses -  Difficulty concentrating or making decisions? N Y  Walking or climbing stairs? N Y  Dressing or bathing? N Y  Doing errands, shopping? N Y  Quarry manager and eating ? N -  Using the Toilet? N -  In the past six months, have you accidently leaked urine? N -  Do you have problems with loss of bowel control? N -  Managing your Medications? N -  Managing your Finances? N -  Housekeeping or managing your Housekeeping? N -  Some recent data might be hidden    Patient Care Team: Alba Cory, MD as PCP - General (Family Medicine) Pa, [facility] as Consulting Physician (Optometry)    Assessment:   This is a routine wellness examination for [facility].  Exercise Activities and Dietary recommendations Current Exercise Habits: Home exercise routine(active at home and in the yard but no structured exercise program), Exercise limited by:  None identified  Goals    . Exercise 150 min/wk Moderate Activity     Recommend to exercise for at least 150 minutes per week.       Fall Risk Fall Risk  09/09/2018 05/19/2018 01/16/2018 09/20/2017 09/06/2017  Falls in the past year? 0 No No No No  Number falls in past yr: 0 - - - -  Injury with Fall? 0 - - - -  Follow up Falls prevention discussed - - - -   FALL RISK PREVENTION PERTAINING TO THE HOME:  Any stairs in or around the  home? Yes  If so, do they handrails? Yes   Home free of loose throw rugs in walkways, pet beds, electrical cords, etc? Yes  Adequate lighting in your home to reduce risk of falls? Yes   ASSISTIVE DEVICES UTILIZED TO PREVENT FALLS:  Life alert? No  Use of a cane, walker or w/c? No  Grab bars in the bathroom? No  Shower chair or bench in shower? No  Elevated toilet seat or a handicapped toilet? No   DME ORDERS:  DME order needed?  No   TIMED UP AND GO:  Was the test performed? Yes .  Length of time to ambulate 10 feet: 5 sec.   GAIT:  Appearance of gait: Gait stead-fast and without the use of an assistive device.  Education: Fall risk prevention has been discussed.  Intervention(s) required? No   Depression Screen PHQ 2/9 Scores 09/09/2018 05/19/2018 01/16/2018 09/06/2017  PHQ - 2 Score 0 0 0 0  PHQ- 9 Score - 0 - -     Cognitive Function     6CIT Screen 09/09/2018 09/06/2017  What Year? 0 points 0 points  What month? 0 points 0 points  What time? 0 points 0 points  Count back from 20 0 points 0 points  Months in reverse 0 points 0 points  Repeat phrase 4 points 4 points  Total Score 4 4    Immunization History  Administered Date(s) Administered  . DT 05/27/2007  . Hepatitis B 10/30/2007, 12/03/2007, 05/04/2008  . Influenza, High Dose Seasonal PF 04/23/2017, 04/04/2018  . Influenza, Seasonal, Injecte, Preservative Fre 05/05/2013  . Influenza,inj,Quad PF,6+ Mos 05/04/2015  . Influenza-Unspecified 05/23/2014, 03/17/2016, 04/23/2017   . Pneumococcal Conjugate-13 09/17/2014  . Pneumococcal Polysaccharide-23 05/03/2008, 09/20/2016  . Tdap 09/27/2010  . Zoster 04/03/2012    Qualifies for Shingles Vaccine? Yes  Zostavax completed 2013. Due for Shingrix. Education has been provided regarding the importance of this vaccine. Pt has been advised to call insurance company to determine out of pocket expense. Advised may also receive vaccine at local pharmacy or Health Dept. Verbalized acceptance and understanding.  Tdap: Up to date  Flu Vaccine: Up to date  Pneumococcal Vaccine: Up to date   Screening Tests Health Maintenance  Topic Date Due  . COLONOSCOPY  12/22/2018  . MAMMOGRAM  02/20/2019  . TETANUS/TDAP  09/26/2020  . INFLUENZA VACCINE  Completed  . DEXA SCAN  Completed  . PNA vac Low Risk Adult  Completed    Cancer Screenings:  Colorectal Screening: Completed 12/21/2008. Repeat every 10 years; No longer required. Referral to GI placed today. Pt aware the office will call re: appt.  Mammogram: Completed 02/19/18. Repeat every year;  Ordered today. Pt provided with contact information and advised to call to schedule appt.   Bone Density: Completed 02/19/18. Results reflect OSTEOPENIA. Repeat every 2 years.   Lung Cancer Screening: (Low Dose CT Chest recommended if Age 13-80 years, 30 pack-year currently smoking OR have quit w/in 15years.) does not qualify.   Additional Screening:  Hepatitis C Screening: no longer required  Vision Screening: Recommended annual ophthalmology exams for early detection of glaucoma and other disorders of the eye. Is the patient up to date with their annual eye exam?  Yes  Who is the provider or what is the name of the office in which the pt attends annual eye exams? [facility]  Dental Screening: Recommended annual dental exams for proper oral hygiene  Community Resource Referral:  CRR required this visit?  No      Plan:     I have personally reviewed and addressed  the Medicare Annual Wellness questionnaire and have noted the following in the patient's chart:  A. Medical and social history B. Use of alcohol, tobacco or illicit drugs  C. Current medications and supplements D. Functional ability and status E.  Nutritional status F.  Physical activity G. Advance directives H. List of other physicians I.  Hospitalizations, surgeries, and ER visits in previous 12 months J.  Vitals K. Screenings such as hearing and vision if needed, cognitive and depression L. Referrals and appointments   In addition, I have reviewed and discussed with patient certain preventive protocols, quality metrics, and best practice recommendations. A written personalized care plan for preventive services as well as general preventive health recommendations were provided to patient.   Signed,  Reather Littler, LPN Nurse Health Advisor   Nurse Notes:Pt's husband diagnosed with lung cancer approx 5 months ago. She is the primary caregiver and 3 of her 4 children live out of state. She is overall doing well and appreciative of visit today.

## 2018-09-23 ENCOUNTER — Encounter: Payer: Self-pay | Admitting: *Deleted

## 2018-11-25 ENCOUNTER — Other Ambulatory Visit: Payer: Self-pay | Admitting: Family Medicine

## 2018-11-25 DIAGNOSIS — I10 Essential (primary) hypertension: Secondary | ICD-10-CM

## 2018-11-25 MED ORDER — VALSARTAN 80 MG PO TABS: 80.0000 mg | ORAL_TABLET | Freq: Every day | ORAL | 1 refills | Status: DC

## 2018-11-25 NOTE — Telephone Encounter (Signed)
Hypertension medication request: Valsartan to [facility]  Last office visit pertaining to hypertension: 09/09/2018   BP Readings from Last 3 Encounters:  09/09/18 122/72  09/09/18 122/72  05/19/18 122/76    Lab Results  Component Value Date   CREATININE 0.83 09/09/2018   BUN 9 09/09/2018   NA 141 09/09/2018   K 4.8 09/09/2018   CL 107 09/09/2018   CO2 28 09/09/2018     Follow up on 01/19/2019

## 2018-11-25 NOTE — Telephone Encounter (Signed)
Copied from CRM [id number]. Topic: Quick Communication - Rx Refill/Question >> Nov 25, 2018  1:09 PM Baldo Daub L wrote: Medication: valsartan (DIOVAN) 80 MG tablet  Has the patient contacted their pharmacy? Yes - no refills left (Agent: If no, request that the patient contact the pharmacy for the refill.) (Agent: If yes, when and what did the pharmacy advise?)  Preferred Pharmacy (with phone number or street name): [facility] - [place], [place] - [address] [phone] (Phone) [phone] (Fax)  Agent: Please be advised that RX refills may take up to 3 business days. We ask that you follow-up with your pharmacy.

## 2018-11-28 ENCOUNTER — Other Ambulatory Visit: Payer: Self-pay | Admitting: Family Medicine

## 2018-11-28 NOTE — Telephone Encounter (Signed)
Refill request for general medication. Fosamax to  [facility]   Last office visit 09/09/2018   Follow up on 01/19/2019

## 2018-12-24 ENCOUNTER — Other Ambulatory Visit: Payer: Self-pay | Admitting: Family Medicine

## 2018-12-24 DIAGNOSIS — E785 Hyperlipidemia, unspecified: Secondary | ICD-10-CM

## 2018-12-25 NOTE — Telephone Encounter (Signed)
Refill Request for Cholesterol medication. Crestor to [facility]  Last visit: 09/09/2018   Lab Results  Component Value Date   CHOL 183 01/16/2018   HDL 65 01/16/2018   LDLCALC 98 01/16/2018   TRIG 103 01/16/2018   CHOLHDL 2.8 01/16/2018    Follow up on 01/19/2019

## 2019-01-19 ENCOUNTER — Ambulatory Visit (INDEPENDENT_AMBULATORY_CARE_PROVIDER_SITE_OTHER): Payer: Medicare PPO | Admitting: Family Medicine

## 2019-01-19 ENCOUNTER — Other Ambulatory Visit: Payer: Self-pay

## 2019-01-19 ENCOUNTER — Encounter: Payer: Self-pay | Admitting: Family Medicine

## 2019-01-19 VITALS — BP 144/76 | HR 97 | Temp 96.8°F | Resp 16 | Ht 65.0 in | Wt 142.3 lb

## 2019-01-19 DIAGNOSIS — E785 Hyperlipidemia, unspecified: Secondary | ICD-10-CM

## 2019-01-19 DIAGNOSIS — M8580 Other specified disorders of bone density and structure, unspecified site: Secondary | ICD-10-CM | POA: Diagnosis not present

## 2019-01-19 DIAGNOSIS — E559 Vitamin D deficiency, unspecified: Secondary | ICD-10-CM | POA: Diagnosis not present

## 2019-01-19 DIAGNOSIS — I10 Essential (primary) hypertension: Secondary | ICD-10-CM

## 2019-01-19 DIAGNOSIS — M170 Bilateral primary osteoarthritis of knee: Secondary | ICD-10-CM | POA: Diagnosis not present

## 2019-01-19 DIAGNOSIS — D692 Other nonthrombocytopenic purpura: Secondary | ICD-10-CM

## 2019-01-19 DIAGNOSIS — Z1211 Encounter for screening for malignant neoplasm of colon: Secondary | ICD-10-CM | POA: Diagnosis not present

## 2019-01-19 DIAGNOSIS — R7301 Impaired fasting glucose: Secondary | ICD-10-CM | POA: Diagnosis not present

## 2019-01-19 NOTE — Progress Notes (Signed)
Name: Yvonne Andrews   MRN: [id number]    DOB: 1940/07/24   Date:01/19/2019       Progress Note  Subjective  Chief Complaint  Chief Complaint  Patient presents with  . Dyslipidemia  . Hypertension  . Hyperglycemia    HPI  HTN: Patient has been compliant with medication and denies side effects, no chest pain, no palpitations, headaches, dizziness , or BLE edema.She was referred toDr. End - cardiologist in May 2018 for bradycardia, but had repeat EKG and was given reassurance. Her bp at home is usually controlled, today in our office bp is high but improved before she left   Hyperlipidemia:Lipid panel June 2019 was atgoal, LDL 98.She has been on Crestor , in the past had elevated LFT's with Lipitor, we tried Pravastatin butwas not strong enough.No myalgias. Takes 81mg  ASA daily. Recheck labs today   Prediabetes/fasting hyperglycemia: hgbA1C was 5.8% June 2019.She tried to avoid sweets. She still likes sweet teaa couple of times a day.She denies polyphagia, polydipsia or polyuria. We will recheck labs   OA right knee: she has intermittent pain on right medial knee , and over the right patella when going down stairs. No effusion, no redness or increase in warmth.Pain level at this time is zero, using when going down steps now it goes up to 6/10   Vitamin D deficiency: taking otc supplementation, continue supplementation Recheck labs  Osteopenia: last bone density done07/2019 and showed mild progression of osteopenia, with FRAX of major fracture at 5% and hip at 1.1%, she is now on Alendronate since July 2019 , no side effects.   Senile purpura: she states she bruises easily on arms or legs. Stable   Patient Active Problem List   Diagnosis Date Noted  . Unspecified glaucoma 06/24/2017  . Body mass index (BMI) of 24.0-24.9 in adult 06/24/2017  . Diverticulosis 03/08/2017  . Vitamin D deficiency 05/29/2016  . Intraocular pressure increase 07/04/2015  . Bilateral  tinnitus 07/04/2015  . Benign essential HTN 12/31/2014  . Carpal tunnel syndrome 12/31/2014  . Dyslipidemia 12/31/2014  . Ovarian failure 12/31/2014  . Osteopenia 12/31/2014  . Allergic rhinitis 12/31/2014  . Arthritis of knee, degenerative 12/31/2014  . Acquired trigger finger 12/31/2014  . Cataracts, bilateral 12/31/2014  . Fasting hyperglycemia 12/31/2014  . LBP (low back pain) 05/27/2007    Past Surgical History:  Procedure Laterality Date  . TRIGGER FINGER RELEASE    . TUBAL LIGATION      Family History  Problem Relation Age of Onset  . Heart disease Mother   . CAD Father   . Heart attack Father 87  . Cancer Brother   . Heart disease Brother   . Breast cancer Neg Hx     Social History   Socioeconomic History  . Marital status: Widowed    Spouse name: Charlotte Crumb  . Number of children: 4  . Years of education: Not on file  . Highest education level: 12th grade  Occupational History  . Occupation: Retired  Scientific laboratory technician  . Financial resource strain: Not hard at all  . Food insecurity    Worry: Never true    Inability: Never true  . Transportation needs    Medical: No    Non-medical: No  Tobacco Use  . Smoking status: Never Smoker  . Smokeless tobacco: Never Used  . Tobacco comment: smoking cessation materials not required  Substance and Sexual Activity  . Alcohol use: No    Alcohol/week: 0.0 standard drinks  .  Drug use: No  . Sexual activity: Not Currently  Lifestyle  . Physical activity    Days per week: 0 days    Minutes per session: Not on file  . Stress: Not at all  Relationships  . Social connections    Talks on phone: More than three times a week    Gets together: More than three times a week    Attends religious service: More than 4 times per year    Active member of club or organization: Yes    Attends meetings of clubs or organizations: More than 4 times per year    Relationship status: Married  . Intimate partner violence    Fear of  current or ex partner: No    Emotionally abused: No    Physically abused: No    Forced sexual activity: No  Other Topics Concern  . Not on file  Social History Narrative   Husband diagnosed with lung cancer Fall of 2019, he died March 2020   One child lives in town      Current Outpatient Medications:  .  alendronate (FOSAMAX) 70 MG tablet, TAKE 1 TABLET EVERY 7 DAYS ON AN EMPTY STOMACH  WITH  FULL  GLASS  OF  WATER, Disp: 12 tablet, Rfl: 3 .  aspirin EC 81 MG tablet, Take 1 tablet (81 mg total) by mouth daily., Disp: 30 tablet, Rfl: 0 .  Cholecalciferol (VITAMIN D) 2000 units CAPS, Take 1 capsule (2,000 Units total) by mouth daily., Disp: 30 capsule, Rfl: 0 .  MULTIPLE VITAMINS-MINERALS ER PO, Take 1 tablet by mouth daily., Disp: , Rfl:  .  Nutritional Supplements (ESTROVEN PM PO), Take by mouth daily., Disp: , Rfl:  .  rosuvastatin (CRESTOR) 10 MG tablet, TAKE 1 TABLET EVERY DAY, Disp: 90 tablet, Rfl: 1 .  timolol (TIMOPTIC) 0.5 % ophthalmic solution, , Disp: , Rfl:  .  valsartan (DIOVAN) 80 MG tablet, Take 1 tablet (80 mg total) by mouth daily. In place of losartan, Disp: 90 tablet, Rfl: 1  Allergies  Allergen Reactions  . Lipitor [Atorvastatin] Other (See Comments)    Elevation of LFT's    I personally reviewed active problem list, medication list, allergies, family history, social history with the patient/caregiver today.   ROS  Constitutional: Negative for fever or weight change.  Respiratory: Negative for cough and shortness of breath.   Cardiovascular: Negative for chest pain or palpitations.  Gastrointestinal: Negative for abdominal pain, no bowel changes.  Musculoskeletal: Negative for gait problem or joint swelling.  Skin: Negative for rash.  Neurological: Negative for dizziness or headache.  No other specific complaints in a complete review of systems (except as listed in HPI above).   Objective  Vitals:   01/19/19 0937 01/19/19 0952  BP: (!) 168/90 (!)  144/76  Pulse: 97   Resp: 16   Temp: (!) 96.8 F (36 C)   TempSrc: Tympanic   SpO2: 99%   Weight: 142 lb 4.8 oz (64.5 kg)   Height: 5\' 5"  (1.651 m)     Body mass index is 23.68 kg/m.  Physical Exam  Constitutional: Patient appears well-developed and well-nourished.  No distress.  HEENT: head atraumatic, normocephalic, pupils equal and reactive to light, neck supple, oral exam not done  Cardiovascular: Normal rate, regular rhythm and normal heart sounds.  No murmur heard. No BLE edema. Pulmonary/Chest: Effort normal and breath sounds normal. No respiratory distress. Abdominal: Soft.  There is no tenderness. Psychiatric: Patient has a normal mood  and affect. behavior is normal. Judgment and thought content normal.  PHQ2/9: Depression screen [facility]HQ 2/9 01/19/2019 09/09/2018 05/19/2018 01/16/2018 09/06/2017  Decreased Interest 0 0 0 0 0  Down, Depressed, Hopeless 0 0 0 0 0  PHQ - 2 Score 0 0 0 0 0  Altered sleeping 0 - 0 - -  Tired, decreased energy 0 - 0 - -  Change in appetite 0 - 0 - -  Feeling bad or failure about yourself  0 - 0 - -  Trouble concentrating 0 - 0 - -  Moving slowly or fidgety/restless 0 - 0 - -  Suicidal thoughts 0 - 0 - -  PHQ-9 Score 0 - 0 - -  Difficult doing work/chores - - Not difficult at all - -    phq 9 is negative   Fall Risk: Fall Risk  01/19/2019 09/09/2018 05/19/2018 01/16/2018 09/20/2017  Falls in the past year? 0 0 No No No  Number falls in past yr: 0 0 - - -  Injury with Fall? 0 0 - - -  Follow up - Falls prevention discussed - - -    Assessment & Plan  1. Senile purpura (HCC)  stable  2. Benign essential HTN  - COMPLETE METABOLIC PANEL WITH GFR - CBC with Differential/Platelet  3. Dyslipidemia  - Lipid panel  4. Vitamin D deficiency  Continue supplementation   5. Fasting hyperglycemia  - Hemoglobin A1c   6. Osteopenia, unspecified location   7. Primary osteoarthritis of both knees  Stable    8. Colon cancer  screening  - Cologuard

## 2019-01-20 LAB — CBC WITH DIFFERENTIAL/PLATELET
Absolute Monocytes: 525 cells/uL (ref 200–950)
Basophils Absolute: 51 cells/uL (ref 0–200)
Basophils Relative: 0.8 %
Eosinophils Absolute: 211 cells/uL (ref 15–500)
Eosinophils Relative: 3.3 %
HCT: 39.2 % (ref 35.0–45.0)
Hemoglobin: 12.8 g/dL (ref 11.7–15.5)
Lymphs Abs: 1933 cells/uL (ref 850–3900)
MCH: 29.7 pg (ref 27.0–33.0)
MCHC: 32.7 g/dL (ref 32.0–36.0)
MCV: 91 fL (ref 80.0–100.0)
MPV: 9.8 fL (ref 7.5–12.5)
Monocytes Relative: 8.2 %
Neutro Abs: 3680 cells/uL (ref 1500–7800)
Neutrophils Relative %: 57.5 %
Platelets: 220 10*3/uL (ref 140–400)
RBC: 4.31 10*6/uL (ref 3.80–5.10)
RDW: 13 % (ref 11.0–15.0)
Total Lymphocyte: 30.2 %
WBC: 6.4 10*3/uL (ref 3.8–10.8)

## 2019-01-20 LAB — COMPLETE METABOLIC PANEL WITH GFR
AG Ratio: 1.8 (calc) (ref 1.0–2.5)
ALT: 20 U/L (ref 6–29)
AST: 24 U/L (ref 10–35)
Albumin: 4.4 g/dL (ref 3.6–5.1)
Alkaline phosphatase (APISO): 51 U/L (ref 37–153)
BUN: 11 mg/dL (ref 7–25)
CO2: 28 mmol/L (ref 20–32)
Calcium: 9.5 mg/dL (ref 8.6–10.4)
Chloride: 107 mmol/L (ref 98–110)
Creat: 0.91 mg/dL (ref 0.60–0.93)
GFR, Est African American: 72 mL/min/{1.73_m2} (ref >=60)
GFR, Est Non African American: 62 mL/min/{1.73_m2} (ref >=60)
Globulin: 2.4 g/dL (calc) (ref 1.9–3.7)
Glucose, Bld: 95 mg/dL (ref 65–99)
Potassium: 4.3 mmol/L (ref 3.5–5.3)
Sodium: 141 mmol/L (ref 135–146)
Total Bilirubin: 0.7 mg/dL (ref 0.2–1.2)
Total Protein: 6.8 g/dL (ref 6.1–8.1)

## 2019-01-20 LAB — LIPID PANEL
Cholesterol: 216 mg/dL — ABNORMAL HIGH (ref <200)
HDL: 77 mg/dL (ref >=50)
LDL Cholesterol (Calc): 116 mg/dL (calc) — ABNORMAL HIGH
Non-HDL Cholesterol (Calc): 139 mg/dL (calc) — ABNORMAL HIGH (ref <130)
Total CHOL/HDL Ratio: 2.8 (calc) (ref <5.0)
Triglycerides: 122 mg/dL (ref <150)

## 2019-01-20 LAB — HEMOGLOBIN A1C
Hgb A1c MFr Bld: 5.8 % of total Hgb — ABNORMAL HIGH (ref <5.7)
Mean Plasma Glucose: 120 (calc)
eAG (mmol/L): 6.6 (calc)

## 2019-01-21 ENCOUNTER — Other Ambulatory Visit: Payer: Self-pay | Admitting: Family Medicine

## 2019-01-21 DIAGNOSIS — E785 Hyperlipidemia, unspecified: Secondary | ICD-10-CM

## 2019-01-22 MED ORDER — ROSUVASTATIN CALCIUM 20 MG PO TABS: 20.0000 mg | ORAL_TABLET | Freq: Every day | ORAL | 0 refills | Status: DC

## 2019-01-22 MED ORDER — ROSUVASTATIN CALCIUM 20 MG PO TABS: 20.0000 mg | ORAL_TABLET | Freq: Every day | ORAL | 1 refills | Status: DC

## 2019-01-22 NOTE — Progress Notes (Signed)
Please send RX to [facility] mail order.

## 2019-01-28 DIAGNOSIS — Z1211 Encounter for screening for malignant neoplasm of colon: Secondary | ICD-10-CM | POA: Diagnosis not present

## 2019-02-05 LAB — COLOGUARD: Cologuard: NEGATIVE

## 2019-02-06 ENCOUNTER — Encounter: Payer: Self-pay | Admitting: Family Medicine

## 2019-02-24 ENCOUNTER — Ambulatory Visit
Admission: RE | Admit: 2019-02-24 | Discharge: 2019-02-24 | Disposition: A | Discharge status: Home / Self Care | Payer: Medicare PPO | Source: Ambulatory Visit | Attending: Family Medicine | Admitting: Family Medicine

## 2019-02-24 DIAGNOSIS — Z1231 Encounter for screening mammogram for malignant neoplasm of breast: Secondary | ICD-10-CM | POA: Insufficient documentation

## 2019-03-25 ENCOUNTER — Telehealth: Payer: Self-pay

## 2019-03-25 ENCOUNTER — Ambulatory Visit (INDEPENDENT_AMBULATORY_CARE_PROVIDER_SITE_OTHER): Payer: Medicare PPO

## 2019-03-25 ENCOUNTER — Other Ambulatory Visit: Payer: Self-pay

## 2019-03-25 DIAGNOSIS — Z23 Encounter for immunization: Secondary | ICD-10-CM | POA: Diagnosis not present

## 2019-03-25 NOTE — Telephone Encounter (Signed)
Copied from [place] [id number]. Topic: General - Other >> Mar 24, 2019  3:16 PM Pauline Good wrote: Reason for CRM: pt want to know do she need the pneumonia shot  please call to advise.  Called to let patient know she is UTD on pneumonia vaccines.

## 2019-04-01 ENCOUNTER — Telehealth: Payer: Self-pay | Admitting: Family Medicine

## 2019-04-01 NOTE — Chronic Care Management (AMB) (Signed)
Chronic Care Management   Note  04/01/2019 Name: Yvonne Andrews MRN: [id number] DOB: 04-Jun-1941  Yvonne Andrews is a 78 y.o. year old female who is a primary care patient of Steele Sizer, MD. I reached out to [name] by phone today in response to a referral sent by [name]'s health plan.    [name] was given information about Chronic Care Management services today including:  1. CCM service includes personalized support from designated clinical staff supervised by her physician, including individualized plan of care and coordination with other care providers 2. 24/7 contact phone numbers for assistance for urgent and routine care needs. 3. Service will only be billed when office clinical staff spend 20 minutes or more in a month to coordinate care. 4. Only one practitioner may furnish and bill the service in a calendar month. 5. The patient may stop CCM services at any time (effective at the end of the month) by phone call to the office staff. 6. The patient will be responsible for cost sharing (co-pay) of up to 20% of the service fee (after annual deductible is met).  Patient agreed to services and verbal consent obtained.   Follow up plan: Telephone appointment with CCM team member scheduled for: 04/24/2019  [place]  ??[email or web address]   ??[phone]

## 2019-04-16 ENCOUNTER — Other Ambulatory Visit: Payer: Self-pay | Admitting: Family Medicine

## 2019-04-16 DIAGNOSIS — E785 Hyperlipidemia, unspecified: Secondary | ICD-10-CM

## 2019-04-21 ENCOUNTER — Other Ambulatory Visit: Payer: Self-pay | Admitting: Family Medicine

## 2019-04-21 DIAGNOSIS — I10 Essential (primary) hypertension: Secondary | ICD-10-CM

## 2019-04-24 ENCOUNTER — Telehealth: Payer: Medicare PPO

## 2019-05-15 ENCOUNTER — Ambulatory Visit (INDEPENDENT_AMBULATORY_CARE_PROVIDER_SITE_OTHER): Payer: Medicare PPO

## 2019-05-15 DIAGNOSIS — E785 Hyperlipidemia, unspecified: Secondary | ICD-10-CM

## 2019-05-15 DIAGNOSIS — I10 Essential (primary) hypertension: Secondary | ICD-10-CM | POA: Diagnosis not present

## 2019-05-15 NOTE — Chronic Care Management (AMB) (Signed)
Chronic Care Management   Initial Visit Note  05/15/2019 Name: Yvonne Andrews MRN: [id number] DOB: [birth year]  Primary Care Provider: Steele Sizer, MD Reason for referral : Chronic Care Management   Yvonne Andrews is a 78 y.o. year old female who is a primary care patient of Steele Sizer, MD. The CCM team was consulted for assistance with chronic disease management and care coordination needs. The primary focus of our conversation today was nutrition and blood pressure management.  Review of patient's status, including review of consultants reports, relevant labs and test results was conducted today. Collaboration with appropriate care team members was performed as part of the comprehensive evaluation and provision of chronic care management services.    SDOH (Social Determinants of Health) screening performed today.  See Care Plan for related entries.    Medications: Outpatient Encounter Medications as of 05/15/2019  Medication Sig Note  . alendronate (FOSAMAX) 70 MG tablet TAKE 1 TABLET EVERY 7 DAYS ON AN EMPTY STOMACH  WITH  FULL  GLASS  OF  WATER   . aspirin EC 81 MG tablet Take 1 tablet (81 mg total) by mouth daily.   . Cholecalciferol (VITAMIN D) 2000 units CAPS Take 1 capsule (2,000 Units total) by mouth daily.   . MULTIPLE VITAMINS-MINERALS ER PO Take 1 tablet by mouth daily. 12/31/2014: Received from: [place]: Take by mouth.  . Nutritional Supplements (ESTROVEN PM PO) Take by mouth daily.   . rosuvastatin (CRESTOR) 20 MG tablet TAKE 1 TABLET BY MOUTH EVERY DAY   . timolol (TIMOPTIC) 0.5 % ophthalmic solution    . valsartan (DIOVAN) 80 MG tablet TAKE 1 TABLET   DAILY. IN PLACE OF [place]    No facility-administered encounter medications on file as of 05/15/2019.      Objective:   BP Readings from Last 3 Encounters:  01/19/19 (!) 144/76  09/09/18 122/72  09/09/18 122/72     Lab Results  Component Value Date   CHOL 216 (H)  01/19/2019   CHOL 183 01/16/2018   CHOL 173 01/21/2017   Lab Results  Component Value Date   HDL 77 01/19/2019   HDL 65 01/16/2018   HDL 66 01/21/2017   Lab Results  Component Value Date   LDLCALC 116 (H) 01/19/2019   LDLCALC 98 01/16/2018   LDLCALC 86 01/21/2017   Lab Results  Component Value Date   TRIG 122 01/19/2019   TRIG 103 01/16/2018   TRIG 107 01/21/2017   Lab Results  Component Value Date   CHOLHDL 2.8 01/19/2019   CHOLHDL 2.8 01/16/2018   CHOLHDL 2.6 01/21/2017    Depression screen PHQ 2/9 05/15/2019 01/19/2019 09/09/2018  Decreased Interest 0 0 0  Down, Depressed, Hopeless 0 0 0  PHQ - 2 Score 0 0 0  Altered sleeping - 0 -  Tired, decreased energy - 0 -  Change in appetite - 0 -  Feeling bad or failure about yourself  - 0 -  Trouble concentrating - 0 -  Moving slowly or fidgety/restless - 0 -  Suicidal thoughts - 0 -  PHQ-9 Score - 0 -  Difficult doing work/chores - - -    Functional Status Survey: Is the patient deaf or have difficulty hearing?: No Does the patient have difficulty seeing, even when wearing glasses/contacts?: No Does the patient have difficulty concentrating, remembering, or making decisions?: No Does the patient have difficulty walking or climbing stairs?: No Does the patient have difficulty dressing or bathing?: No  Does the patient have difficulty doing errands alone such as visiting a doctor's office or shopping?: No   Goals Addressed            This Visit's Progress   . Improve ability to self-manage blood pressure and dyslipidemia. (pt-stated)       Current Barriers:  . Chronic Disease Management support and education needs related to dyslipidemia and hypertension management  Case Manager Clinical Goal(s):  Marland Kitchen Over the next 90 days, patient will take all medications as prescribed. . Over the next 90 days, patient will attend all scheduled medical appointments. . Over the next 90 days, patient will monitor blood pressure  and record readings. . Over the next 90 days, patient will increase compliance with heart health/cardiac prudent diet.  Interventions:  . Reviewed medications and discussed indications for use. Patient encouraged to take all medications as prescribed and notify MD if unable tolerate regimen. Patient denied concerns regarding medication adherence or prescription costs. . Discussed current blood pressure readings. Patient reports systolic ranges from the 010'X to 130's. Reports diastolic readings in the 32'T. Patient encouraged to monitor BP daily. . Provided education regarding blood pressure parameters and indications for notifying MD. Patient encouraged to maintain a BP log to identify trends. . Provided education regarding nutrition and dietary intake. Patient encouraged to increase compliance with heart healthy/cardiac prudent diet. . Patient encouraged to continue mild exercises/activities and walks as tolerated. . Reviewed pending provider appointments. Patient encouraged to attend appointments as scheduled to prevent delays in care. . Discussed plans for ongoing care management follow up and provided patient with direct contact information for care management team   Patient Self Care Activities:  . Self administers medications as prescribed . Attends scheduled provider appointments . Calls pharmacy for medication refills . Performs ADL's independently . Performs IADL's independently . Calls provider office for new concerns or questions  Initial goal documentation          [name] was given information about Chronic Care Management services  including:  1. CCM service includes personalized support from designated clinical staff supervised by her physician, including individualized plan of care and coordination with other care providers 2. 24/7 contact phone numbers for assistance for urgent and routine care needs. 3. Service will only be billed when office clinical staff spend 20  minutes or more in a month to coordinate care. 4. Only one practitioner may furnish and bill the service in a calendar month. 5. The patient may stop CCM services at any time (effective at the end of the month) by phone call to the office staff. 6. The patient will be responsible for cost sharing (co-pay) of up to 20% of the service fee (after annual deductible is met).  Patient agreed to services and verbal consent was obtained on 04/01/19.   Follow-Up Plan -Ms. Vanduyn has been provided with contact information for the care management team and has been advised to call with any health related questions or concerns.  -The care management team will reach out to Ms. Adcock again within the next two months.    [facility] [phone]

## 2019-06-05 ENCOUNTER — Telehealth: Payer: Self-pay

## 2019-06-15 ENCOUNTER — Telehealth: Payer: Self-pay

## 2019-06-22 DIAGNOSIS — H40053 Ocular hypertension, bilateral: Secondary | ICD-10-CM | POA: Diagnosis not present

## 2019-07-09 IMAGING — MG MM DIGITAL SCREENING BILAT W/ TOMO W/ CAD
9 of 12 series · 9 of 28 positions shown · non-contrast
Comparison: Previous exam(s).

CLINICAL DATA: Screening.

EXAM:
2D DIGITAL SCREENING BILATERAL MAMMOGRAM WITH CAD AND ADJUNCT TOMO

[L MLO synth-2D]
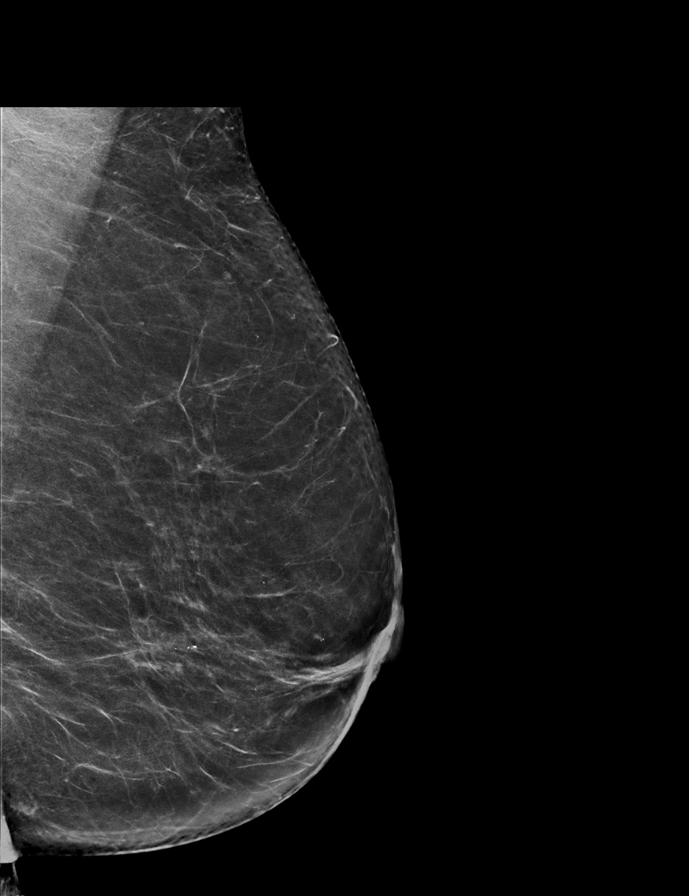

[L CC]
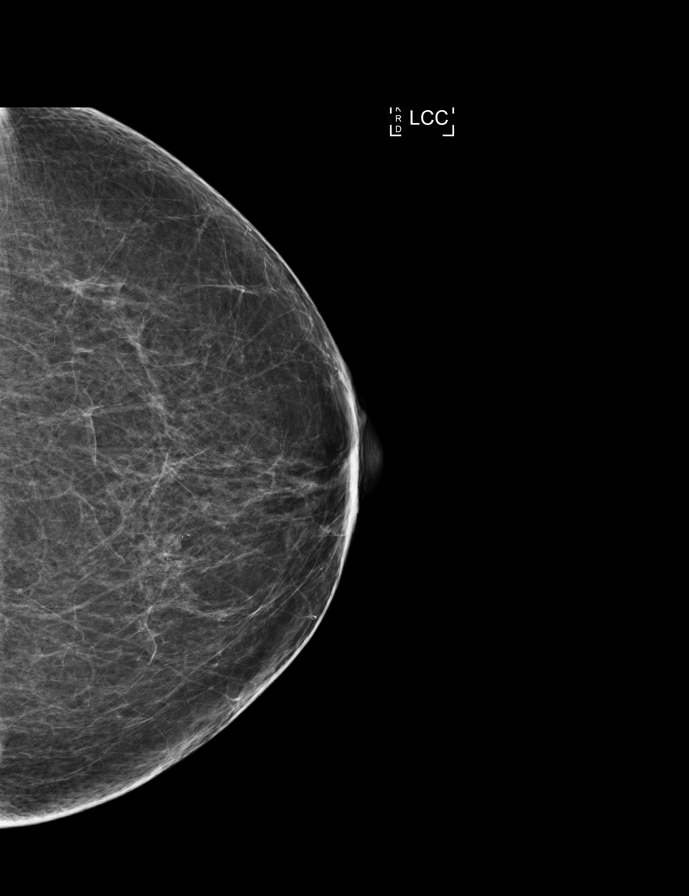

[R MLO synth-2D]
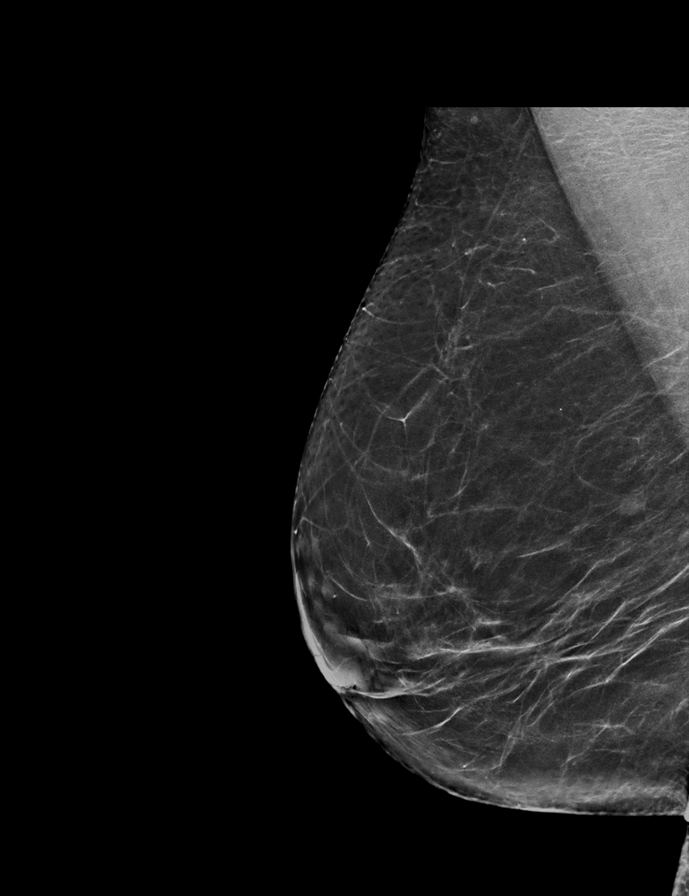

[R MLO]
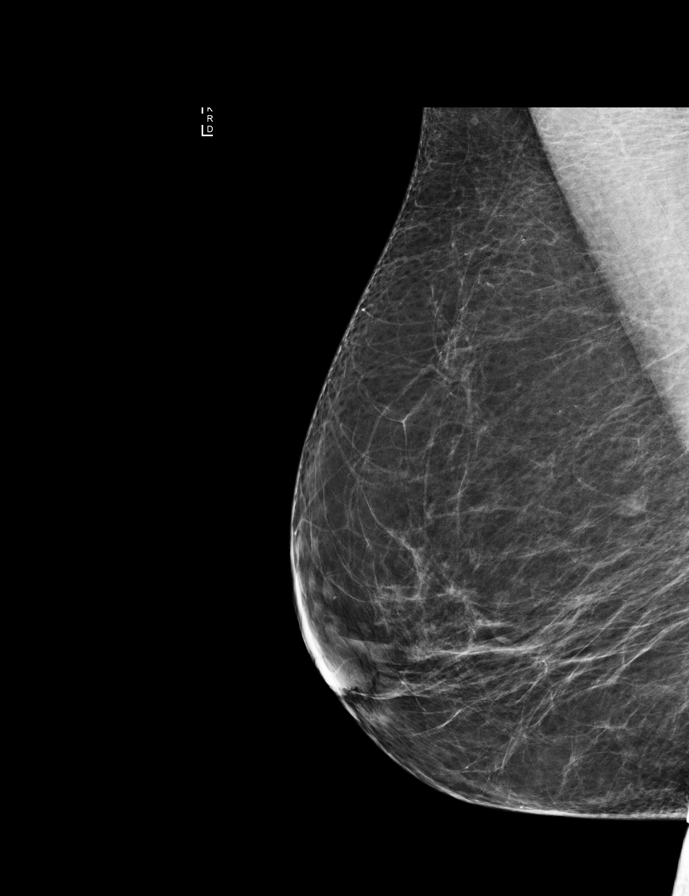

[R CC]
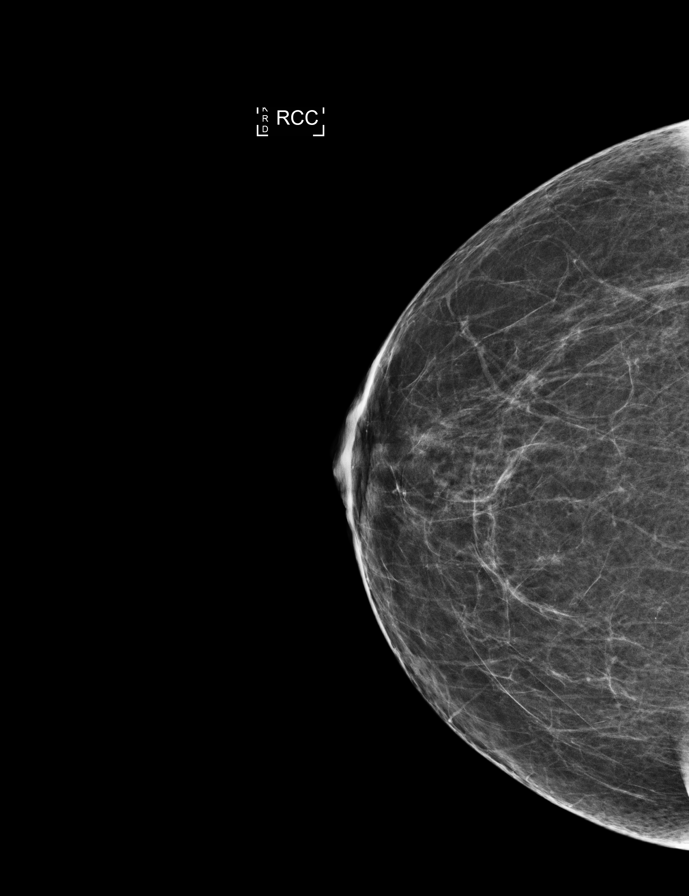

[R CC synth-2D]
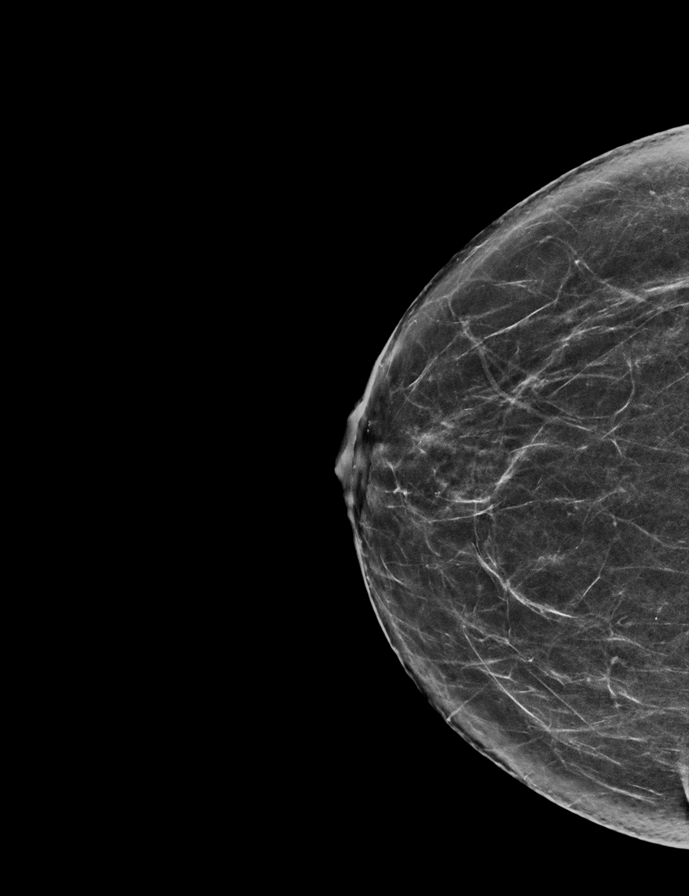

[L CC synth-2D]
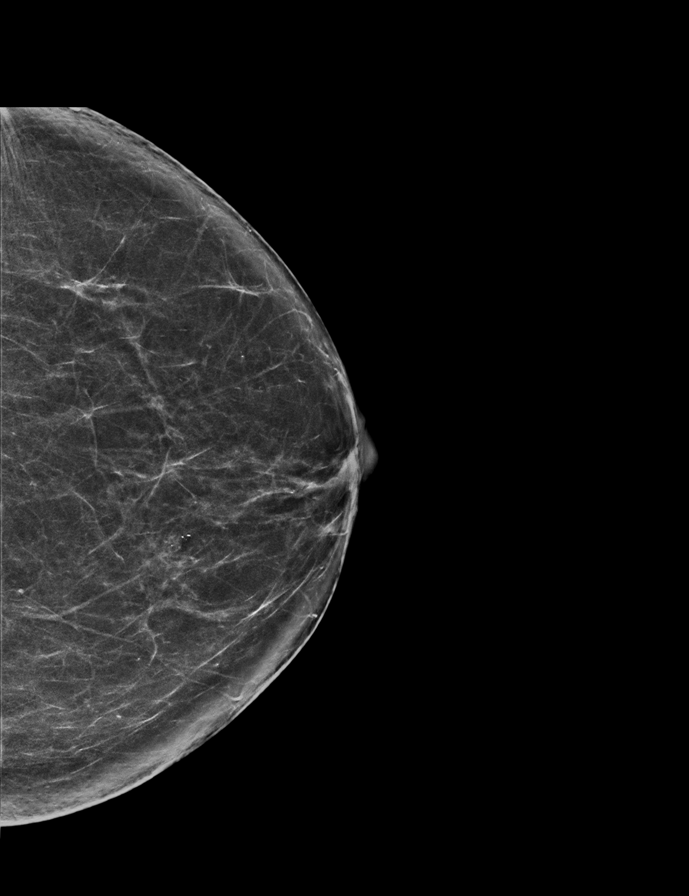

[L MLO]
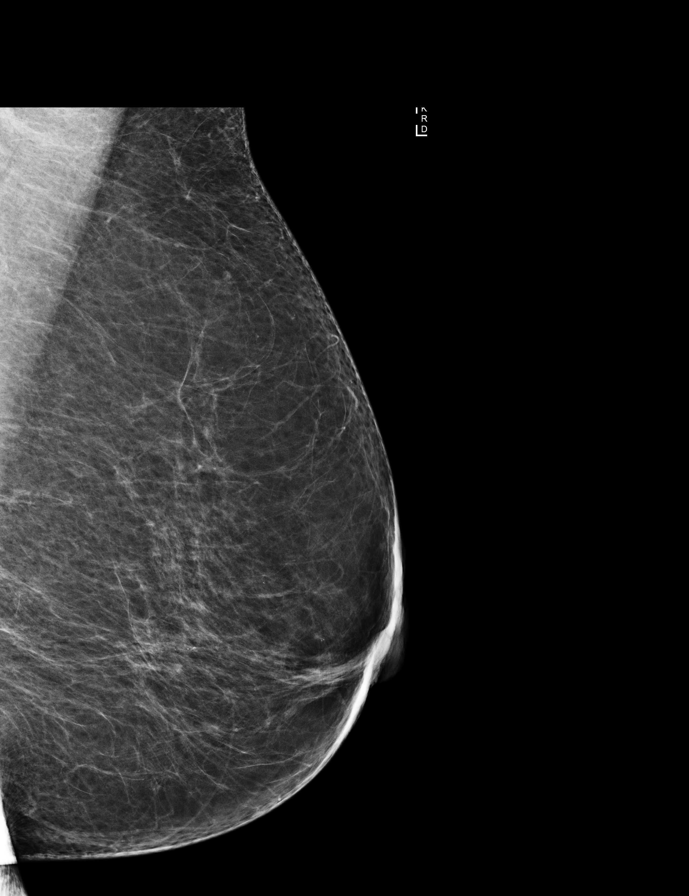

[L CC tomo · tomo slice 34/67.0]
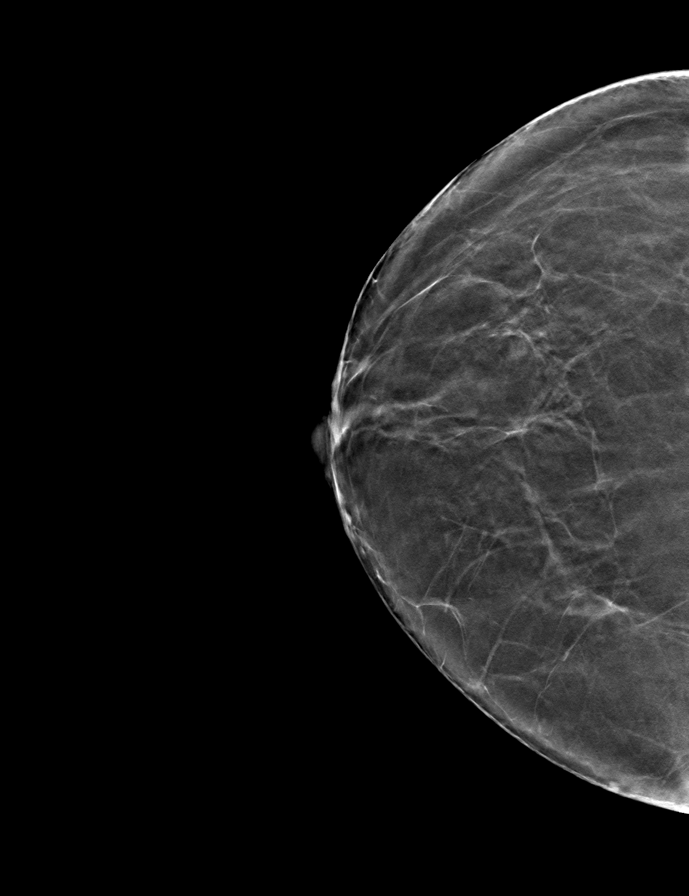

[9 of 28 positions shown; findings below may reference images not displayed]

ACR Breast Density Category b: There are scattered areas of
fibroglandular density.
FINDINGS: There are no findings suspicious for malignancy. Images were
processed with CAD.
IMPRESSION: No mammographic evidence of malignancy. A result letter of this
screening mammogram will be mailed directly to the patient.

RECOMMENDATION:
Screening mammogram in one year. (Code:97-6-RS4)

BI-RADS CATEGORY  1: Negative.

## 2019-07-21 ENCOUNTER — Ambulatory Visit: Payer: Medicare PPO | Admitting: Family Medicine

## 2019-07-27 ENCOUNTER — Ambulatory Visit (INDEPENDENT_AMBULATORY_CARE_PROVIDER_SITE_OTHER): Payer: Medicare PPO | Admitting: Family Medicine

## 2019-07-27 ENCOUNTER — Encounter: Payer: Self-pay | Admitting: Family Medicine

## 2019-07-27 VITALS — BP 133/75 | HR 65 | Ht 65.0 in | Wt 143.0 lb

## 2019-07-27 DIAGNOSIS — E559 Vitamin D deficiency, unspecified: Secondary | ICD-10-CM | POA: Diagnosis not present

## 2019-07-27 DIAGNOSIS — F5101 Primary insomnia: Secondary | ICD-10-CM | POA: Diagnosis not present

## 2019-07-27 DIAGNOSIS — D692 Other nonthrombocytopenic purpura: Secondary | ICD-10-CM | POA: Diagnosis not present

## 2019-07-27 DIAGNOSIS — R7301 Impaired fasting glucose: Secondary | ICD-10-CM | POA: Diagnosis not present

## 2019-07-27 DIAGNOSIS — I10 Essential (primary) hypertension: Secondary | ICD-10-CM

## 2019-07-27 DIAGNOSIS — E785 Hyperlipidemia, unspecified: Secondary | ICD-10-CM

## 2019-07-27 DIAGNOSIS — M8580 Other specified disorders of bone density and structure, unspecified site: Secondary | ICD-10-CM | POA: Diagnosis not present

## 2019-07-27 MED ORDER — VALSARTAN 80 MG PO TABS: 80.0000 mg | ORAL_TABLET | Freq: Every day | ORAL | 1 refills | Status: DC

## 2019-07-27 MED ORDER — ROSUVASTATIN CALCIUM 20 MG PO TABS: 20.0000 mg | ORAL_TABLET | Freq: Every day | ORAL | 1 refills | Status: DC

## 2019-07-27 MED ORDER — TRAZODONE HCL 50 MG PO TABS: 25.0000 mg | ORAL_TABLET | Freq: Every evening | ORAL | 0 refills | Status: DC | PRN

## 2019-07-27 NOTE — Patient Instructions (Addendum)
Thank you for allowing the Chronic Care Management team to participate in your care.  Goals    . Exercise 150 min/wk Moderate Activity     Recommend to exercise for at least 150 minutes per week.    . Improve ability to self-manage blood pressure and dyslipidemia. (pt-stated)     Current Barriers:  . Chronic Disease Management support and education needs related to dyslipidemia and hypertension management  Case Manager Clinical Goal(s):  Marland Kitchen Over the next 90 days, patient will take all medications as prescribed. . Over the next 90 days, patient will attend all scheduled medical appointments. . Over the next 90 days, patient will monitor blood pressure and record readings. . Over the next 90 days, patient will increase compliance with heart health/cardiac prudent diet.  Interventions:  . Reviewed medications and discussed indications for use. Patient encouraged to take all medications as prescribed and notify MD if unable tolerate regimen. Patient denied concerns regarding medication adherence or prescription costs. . Discussed current blood pressure readings. Patient reports systolic ranges from the 938'H to 130's. Reports diastolic readings in the 82'X. Patient encouraged to monitor BP daily. . Provided education regarding blood pressure parameters and indications for notifying MD. Patient encouraged to maintain a BP log to identify trends. . Provided education regarding nutrition and dietary intake. Patient encouraged to increase compliance with heart healthy/cardiac prudent diet. . Patient encouraged to continue mild exercises/activities and walks as tolerated. . Reviewed pending provider appointments. Patient encouraged to attend appointments as scheduled to prevent delays in care. . Discussed plans for ongoing care management follow up and provided patient with direct contact information for care management team   Patient Self Care Activities:  . Self administers medications as  prescribed . Attends scheduled provider appointments . Calls pharmacy for medication refills . Performs ADL's independently . Performs IADL's independently . Calls provider office for new concerns or questions  Initial goal documentation        Yvonne Andrews was given information about Chronic Care Management services including:  1. CCM service includes personalized support from designated clinical staff supervised by her physician, including individualized plan of care and coordination with other care providers 2. 24/7 contact phone numbers for assistance for urgent and routine care needs. 3. Service will only be billed when office clinical staff spend 20 minutes or more in a month to coordinate care. 4. Only one practitioner may furnish and bill the service in a calendar month. 5. The patient may stop CCM services at any time (effective at the end of the month) by phone call to the office staff. 6. The patient will be responsible for cost sharing (co-pay) of up to 20% of the service fee (after annual deductible is met).   Patient agreed to services and verbal consent obtained on 04/01/19.   Yvonne Andrews verbalized understanding of instructions provided today. Outreach was conducted telephonically. A printed copy of the instructions was not provided.   The care management team will reach out to Yvonne Andrews again within two months.   [facility] [phone]

## 2019-07-27 NOTE — Progress Notes (Signed)
Name: Yvonne Andrews   MRN: [id number]    DOB: Jun 19, 1941   Date:07/27/2019       Progress Note  Subjective  Chief Complaint  Chief Complaint  Patient presents with  . Medication Refill  . Hypertension  . Hyperlipidemia  . OA right knee  . Osteopenia  . Senile purpura    I connected with  Yvonne Andrews on 07/27/19 at 11:20 AM EST by telephone and verified that I am speaking with the correct person using two identifiers.  I discussed the limitations, risks, security and privacy concerns of performing an evaluation and management service by telephone and the availability of in person appointments. Staff also discussed with the patient that there may be a patient responsible charge related to this service. Patient Location: at home  Provider Location: [facility]   HPI  HTN: Patient has been compliant with medication and denies side effects, no chest pain, no palpitations, headaches,dizziness, or BLE edema.She was referred toDr. End - cardiologist in May 2018 for bradycardia, but had repeat EKG and was given reassurance.BP has been well controlled   Hyperlipidemia:Lipid panel June 2019 was atgoal, LDL 98.She has been on Crestor , in the past had elevated LFT's with Lipitor, we tried Pravastatin butwas not strong enough.No myalgias. Takes 81mg  ASA daily.Last LDL went up from 98 to 116 , discussed healthier diet   Prediabetes/fasting hyperglycemia: hgbA1C was 5.8% June 2019 and also in 2020 .She tried to avoid sweets but still likes sweet tea.She denies polyphagia, polydipsia or polyuria.  OA right knee: she has intermittent pain on right medial knee , and over the right patella when going down stairs. No effusion, no redness or increase in warmth.Pain level does not interfere with her ADL  Vitamin D deficiency: taking otc supplementation, continue supplementationUnchanged   Osteopenia: last bone density done07/2019 and showed mild progression  of osteopenia, with FRAX of major fracture at 5% and hip at 1.1%, she is now on Alendronate since July 2019 , recheck in 2021 when she goes back for a mammogram   Senile purpura: she states she bruises easily on arms or legs. Stable   Patient Active Problem List   Diagnosis Date Noted  . Unspecified glaucoma 06/24/2017  . Body mass index (BMI) of 24.0-24.9 in adult 06/24/2017  . Diverticulosis 03/08/2017  . Vitamin D deficiency 05/29/2016  . Intraocular pressure increase 07/04/2015  . Bilateral tinnitus 07/04/2015  . Benign essential HTN 12/31/2014  . Carpal tunnel syndrome 12/31/2014  . Dyslipidemia 12/31/2014  . Ovarian failure 12/31/2014  . Osteopenia 12/31/2014  . Allergic rhinitis 12/31/2014  . Arthritis of knee, degenerative 12/31/2014  . Acquired trigger finger 12/31/2014  . Cataracts, bilateral 12/31/2014  . Fasting hyperglycemia 12/31/2014  . LBP (low back pain) 05/27/2007    Past Surgical History:  Procedure Laterality Date  . TRIGGER FINGER RELEASE    . TUBAL LIGATION      Family History  Problem Relation Age of Onset  . Heart disease Mother   . CAD Father   . Heart attack Father 41  . Cancer Brother   . Heart disease Brother   . Breast cancer Niece        sisters daughter    Social History   Socioeconomic History  . Marital status: Widowed    Spouse name: Yvonne Andrews  . Number of children: 4  . Years of education: Not on file  . Highest education level: 12th grade  Occupational History  . Occupation:  Retired  Tobacco Use  . Smoking status: Never Smoker  . Smokeless tobacco: Never Used  . Tobacco comment: smoking cessation materials not required  Substance and Sexual Activity  . Alcohol use: No    Alcohol/week: 0.0 standard drinks  . Drug use: No  . Sexual activity: Not Currently  Other Topics Concern  . Not on file  Social History Narrative   Husband diagnosed with lung cancer Fall of 2019, he died 11-06-18  One child lives in town     Social Determinants of Health   Financial Resource Strain:   . Difficulty of Paying Living Expenses: Not on file  Food Insecurity:   . Worried About Programme researcher, broadcasting/film/video in the Last Year: Not on file  . Ran Out of Food in the Last Year: Not on file  Transportation Needs:   . Lack of Transportation (Medical): Not on file  . Lack of Transportation (Non-Medical): Not on file  Physical Activity: Unknown  . Days of Exercise per Week: 0 days  . Minutes of Exercise per Session: Not asked  Stress:   . Feeling of Stress : Not on file  Social Connections:   . Frequency of Communication with Friends and Family: Not on file  . Frequency of Social Gatherings with Friends and Family: Not on file  . Attends Religious Services: Not on file  . Active Member of Clubs or Organizations: Not on file  . Attends Banker Meetings: Not on file  . Marital Status: Not on file  Intimate Partner Violence:   . Fear of Current or Ex-Partner: Not on file  . Emotionally Abused: Not on file  . Physically Abused: Not on file  . Sexually Abused: Not on file     Current Outpatient Medications:  .  alendronate (FOSAMAX) 70 MG tablet, TAKE 1 TABLET EVERY 7 DAYS ON AN EMPTY STOMACH  WITH  FULL  GLASS  OF  WATER, Disp: 12 tablet, Rfl: 3 .  aspirin EC 81 MG tablet, Take 1 tablet (81 mg total) by mouth daily., Disp: 30 tablet, Rfl: 0 .  Cholecalciferol (VITAMIN D) 2000 units CAPS, Take 1 capsule (2,000 Units total) by mouth daily., Disp: 30 capsule, Rfl: 0 .  MULTIPLE VITAMINS-MINERALS ER PO, Take 1 tablet by mouth daily., Disp: , Rfl:  .  Nutritional Supplements (ESTROVEN PM PO), Take by mouth daily., Disp: , Rfl:  .  rosuvastatin (CRESTOR) 20 MG tablet, TAKE 1 TABLET BY MOUTH EVERY DAY, Disp: 90 tablet, Rfl: 0 .  timolol (TIMOPTIC) 0.5 % ophthalmic solution, Place 1 drop into both eyes 2 (two) times daily. , Disp: , Rfl:  .  valsartan (DIOVAN) 80 MG tablet, TAKE 1 TABLET   DAILY. IN PLACE OF LOSARTAN,  Disp: 90 tablet, Rfl: 1  Allergies  Allergen Reactions  . Lipitor [Atorvastatin] Other (See Comments)    Elevation of LFT's    I personally reviewed active problem list, medication list, allergies, family history, social history, health maintenance with the patient/caregiver today.   ROS  Ten systems reviewed and is negative except as mentioned in HPI   Objective  Virtual encounter, vitals not obtained.  Body mass index is 23.8 kg/m.  Physical Exam  Awake, alert and oriented  PHQ2/9: Depression screen [facility] 2/9 07/27/2019 05/15/2019 01/19/2019 09/09/2018 05/19/2018  Decreased Interest 0 0 0 0 0  Down, Depressed, Hopeless 0 0 0 0 0  PHQ - 2 Score 0 0 0 0 0  Altered sleeping 0 -  0 - 0  Tired, decreased energy 0 - 0 - 0  Change in appetite 0 - 0 - 0  Feeling bad or failure about yourself  0 - 0 - 0  Trouble concentrating 0 - 0 - 0  Moving slowly or fidgety/restless 0 - 0 - 0  Suicidal thoughts 0 - 0 - 0  PHQ-9 Score 0 - 0 - 0  Difficult doing work/chores Not difficult at all - - - Not difficult at all   PHQ-2/9 Result is negative.    Fall Risk: Fall Risk  07/27/2019 05/15/2019 01/19/2019 09/09/2018 05/19/2018  Falls in the past year? 0 0 0 0 No  Number falls in past yr: 0 - 0 0 -  Injury with Fall? 0 - 0 0 -  Follow up - Falls prevention discussed - Falls prevention discussed -     Assessment & Plan  1. Dyslipidemia  - rosuvastatin (CRESTOR) 20 MG tablet; Take 1 tablet (20 mg total) by mouth daily.  Dispense: 90 tablet; Refill: 1  2. Benign essential HTN  - valsartan (DIOVAN) 80 MG tablet; Take 1 tablet (80 mg total) by mouth daily.  Dispense: 90 tablet; Refill: 1  3. Senile purpura (HCC)   4. Vitamin D deficiency  Continue vitamin D otc   5. Osteopenia, unspecified location  Recheck bone density this Fall   6. Fasting hyperglycemia   7. Primary insomnia  - traZODone (DESYREL) 50 MG tablet; Take 0.5-1 tablets (25-50 mg total) by mouth at bedtime as  needed for sleep.  Dispense: 90 tablet; Refill: 0  I discussed the assessment and treatment plan with the patient. The patient was provided an opportunity to ask questions and all were answered. The patient agreed with the plan and demonstrated an understanding of the instructions.   The patient was advised to call back or seek an in-person evaluation if the symptoms worsen or if the condition fails to improve as anticipated.  I provided 25 minutes of non-face-to-face time during this encounter.  Ruel Favors, MD

## 2019-08-07 ENCOUNTER — Telehealth: Payer: Self-pay

## 2019-08-19 ENCOUNTER — Telehealth: Payer: Self-pay

## 2019-08-26 ENCOUNTER — Ambulatory Visit (INDEPENDENT_AMBULATORY_CARE_PROVIDER_SITE_OTHER): Payer: Medicare PPO

## 2019-08-26 DIAGNOSIS — I10 Essential (primary) hypertension: Secondary | ICD-10-CM | POA: Diagnosis not present

## 2019-08-26 DIAGNOSIS — E785 Hyperlipidemia, unspecified: Secondary | ICD-10-CM | POA: Diagnosis not present

## 2019-08-26 NOTE — Chronic Care Management (AMB) (Signed)
Chronic Care Management   Follow Up Note   08/26/2019 Name: Yvonne Andrews MRN: [id number] DOB: 12/17/1940  Primary Care Provider: Alba Cory, MD Reason for referral : Complex Case Management   Yvonne Andrews is a 79 y.o. year old female who is a primary care patient of Alba Cory, MD. The CCM team was consulted for assistance with chronic disease management and care coordination needs.  The primary focus of our conversation was blood pressure management. We also discussed diet modifications to improve management of hyperlipidemia. Yvonne Andrews expressed concerns related to bilateral tinnitus. She indicated that it has persisted "off and on" for several months. She denied headaches or dizziness but has experienced occasional pulsing. The clinical team was contacted. She will be evaluated by a provider on 09/03/19.  Review of patient's status, including review of consultants reports, relevant labs and test results was conducted today. Collaboration with appropriate care team members was performed as part of the comprehensive evaluation and provision of chronic care management services.       Outpatient Encounter Medications as of 08/26/2019  Medication Sig Note  . alendronate (FOSAMAX) 70 MG tablet TAKE 1 TABLET EVERY 7 DAYS ON AN EMPTY STOMACH  WITH  FULL  GLASS  OF  WATER   . aspirin EC 81 MG tablet Take 1 tablet (81 mg total) by mouth daily.   . Cholecalciferol (VITAMIN D) 2000 units CAPS Take 1 capsule (2,000 Units total) by mouth daily.   . MULTIPLE VITAMINS-MINERALS ER PO Take 1 tablet by mouth daily. 12/31/2014: Received from: [employer] Received Sig: Take by mouth.  . Nutritional Supplements (ESTROVEN PM PO) Take by mouth daily.   . rosuvastatin (CRESTOR) 20 MG tablet Take 1 tablet (20 mg total) by mouth daily.   . timolol (TIMOPTIC) 0.5 % ophthalmic solution Place 1 drop into both eyes 2 (two) times daily.    . traZODone (DESYREL) 50 MG tablet Take 0.5-1 tablets  (25-50 mg total) by mouth at bedtime as needed for sleep.   . valsartan (DIOVAN) 80 MG tablet Take 1 tablet (80 mg total) by mouth daily.    No facility-administered encounter medications on file as of 08/26/2019.     Objective:  BP Readings from Last 3 Encounters:  07/27/19 133/75  01/19/19 (!) 144/76  09/09/18 122/72    Goals Addressed            This Visit's Progress   . Improve ability to self-manage blood pressure and dyslipidemia. (pt-stated)   On track    Current Barriers:  . Chronic Disease Management support and education needs related to dyslipidemia and hypertension management  Case Manager Clinical Goal(s):  Marland Kitchen Over the next 90 days, patient will continue taking medications as prescribed. . Over the next 90 days, patient will attend all scheduled medical appointments. . Over the next 90 days, patient will continue monitoring blood pressure and record readings. . Over the next 90 days, patient will increase compliance with heart health/cardiac prudent diet.  Interventions:  . Reviewed medications and discussed indications for use. Patient encouraged to take all medications as prescribed and notify MD if unable tolerate regimen. Patient denied concerns regarding medication adherence or prescription costs. Anticipates receiving initial Covid-19 vaccine on 08/31/19. Marland Kitchen Discussed current blood pressure readings. Patient reports most systolic readings have ranged in the 120's. Reports one reading in the 140's. Reports diastolic readings in the 70's. Reports pulse ranges in from 60 to high 70's. Patient encouraged to continue monitoring BP  daily. . Reviewed education regarding blood pressure parameters and indications for notifying MD. Patient encouraged to maintain a BP log to identify trends. . Reviewed education regarding nutrition and dietary intake. Patient encouraged to increase compliance with heart healthy/cardiac prudent diet. . Patient encouraged to continue mild  exercises/activities and walks as tolerated. . Reviewed pending provider appointments. Patient encouraged to attend appointments as scheduled to prevent delays in care. Patient reports concerns regarding ringing in both ears. Scheduled appointment for evaluation by a provider on 09/03/19. Marland Kitchen Discussed plans for ongoing care management follow up and provided patient with direct contact information for care management team   Patient Self Care Activities:  . Self administers medications as prescribed . Attends scheduled provider appointments . Calls pharmacy for medication refills . Performs ADL's independently . Performs IADL's independently . Calls provider office for new concerns or questions  Please see past updates related to this goal by clicking on the "Past Updates" button in the selected goal         PLAN The care management team will reach out to Yvonne Andrews again within the next 30 days. She was encouraged to call with health related concerns if needed prior to the scheduled outreach.    [facility] [phone]

## 2019-08-28 ENCOUNTER — Ambulatory Visit: Payer: Self-pay

## 2019-08-28 NOTE — Patient Instructions (Addendum)
Thank you for allowing the Chronic Care Management team to participate in your care.   Goals Addressed            This Visit's Progress   . Improve ability to self-manage blood pressure and dyslipidemia. (pt-stated)   On track    Current Barriers:  . Chronic Disease Management support and education needs related to dyslipidemia and hypertension management  Case Manager Clinical Goal(s):  Marland Kitchen Over the next 90 days, patient will continue taking medications as prescribed. . Over the next 90 days, patient will attend all scheduled medical appointments. . Over the next 90 days, patient will continue monitoring blood pressure and record readings. . Over the next 90 days, patient will increase compliance with heart health/cardiac prudent diet.  Interventions:  . Reviewed medications and discussed indications for use. Patient encouraged to take all medications as prescribed and notify MD if unable tolerate regimen. Patient denied concerns regarding medication adherence or prescription costs. Anticipates receiving initial Covid-19 vaccine on 08/31/19. Marland Kitchen Discussed current blood pressure readings. Patient reports most systolic readings have ranged in the 120's. Reports one reading in the 140's. Reports diastolic readings in the 70's. Reports pulse ranges in from 60 to high 70's. Patient encouraged to continue monitoring BP daily. . Reviewed education regarding blood pressure parameters and indications for notifying MD. Patient encouraged to maintain a BP log to identify trends. . Reviewed education regarding nutrition and dietary intake. Patient encouraged to increase compliance with heart healthy/cardiac prudent diet. . Patient encouraged to continue mild exercises/activities and walks as tolerated. . Reviewed pending provider appointments. Patient encouraged to attend appointments as scheduled to prevent delays in care. Patient reports concerns regarding ringing in both ears. Scheduled appointment for  evaluation by a provider on 09/03/19. Marland Kitchen Discussed plans for ongoing care management follow up and provided patient with direct contact information for care management team   Patient Self Care Activities:  . Self administers medications as prescribed . Attends scheduled provider appointments . Calls pharmacy for medication refills . Performs ADL's independently . Performs IADL's independently . Calls provider office for new concerns or questions  Please see past updates related to this goal by clicking on the "Past Updates" button in the selected goal          Yvonne Andrews verbalized understanding of instructions provided during the telephonic outreach today. A copy of the instructions was not provided today.    The care management team will reach out to Yvonne Andrews again within the next 30 days.    [facility] [phone]

## 2019-08-28 NOTE — Chronic Care Management (AMB) (Signed)
  Chronic Care Management   Note  08/28/2019 Name: Yvonne Andrews MRN: [id number] DOB: 05/12/1941   CARE COORDINATION   Outreach with [facility] team. Per staff, Yvonne Andrews primary care provider will not have availability on 09/03/19 but clinic PA is available to complete the evaluation.   Yvonne Andrews was contacted and agreed to complete scheduled appointment with the PA.   PLAN The care management team will follow-up as scheduled within the next 30 days.   [facility] [phone]

## 2019-08-31 ENCOUNTER — Ambulatory Visit: Payer: Medicare PPO | Attending: Internal Medicine

## 2019-08-31 DIAGNOSIS — Z23 Encounter for immunization: Secondary | ICD-10-CM

## 2019-08-31 NOTE — Progress Notes (Signed)
   Covid-19 Vaccination Clinic  Name:  Yvonne Andrews    MRN: [id number] DOB: August 10, 1940  08/31/2019  Yvonne Andrews was observed post Covid-19 immunization for 15 minutes without incidence. She was provided with Vaccine Information Sheet and instruction to access the V-Safe system.   Yvonne Andrews was instructed to call 911 with any severe reactions post vaccine: Marland Kitchen Difficulty breathing  . Swelling of your face and throat  . A fast heartbeat  . A bad rash all over your body  . Dizziness and weakness    Immunizations Administered    Name Date Dose VIS Date Route   Moderna COVID-19 Vaccine 08/31/2019  1:07 PM 0.5 mL 06/23/2019 Intramuscular   Manufacturer: Moderna   Lot: [id number]   NDC: 59563-875-64

## 2019-09-03 ENCOUNTER — Ambulatory Visit: Payer: Medicare PPO | Admitting: Internal Medicine

## 2019-09-03 ENCOUNTER — Other Ambulatory Visit: Payer: Self-pay

## 2019-09-03 ENCOUNTER — Encounter: Payer: Self-pay | Admitting: Internal Medicine

## 2019-09-03 VITALS — BP 120/74 | HR 64 | Temp 97.8°F | Resp 16 | Ht 65.0 in | Wt 146.6 lb

## 2019-09-03 DIAGNOSIS — I10 Essential (primary) hypertension: Secondary | ICD-10-CM | POA: Diagnosis not present

## 2019-09-03 DIAGNOSIS — H938X3 Other specified disorders of ear, bilateral: Secondary | ICD-10-CM

## 2019-09-03 DIAGNOSIS — H9313 Tinnitus, bilateral: Secondary | ICD-10-CM | POA: Diagnosis not present

## 2019-09-03 NOTE — Progress Notes (Signed)
Patient ID: Yvonne Andrews, female    DOB: [birth year], 79 y.o.   MRN: [id number]  PCP: Alba Cory, MD  Chief Complaint  Patient presents with  . Tinnitus    right and left onset about a month ago "I can hear my heartbeat in the ears"    Subjective:   Yvonne Andrews is a 79 y.o. female, presents to clinic with CC of the following:  Chief Complaint  Patient presents with  . Tinnitus    right and left onset about a month ago "I can hear my heartbeat in the ears"    HPI:  Patient is a 79 year old female, patient of  Dr. Carlynn Purl, who notes that she has had ringing in her ears for years and years.  It really has not significantly changed in the recent past.  She has noted more recently that she can hear her heartbeat more in her ears, often on one side or the other depending on which side she is laying on.  She denies any diminished hearing in the recent past.  No recent fevers, congestion, postnasal drip, cough, or other concerning infectious type symptoms.  She denies any ear pains.  She does not use Q-tips. She denies any recent chest pains, shortness of breath, balance difficulties, or other more concerning symptoms. Of note, she had her first Covid vaccine last Monday, and has plans for a follow-up for her second dose. She does take a baby aspirin a day, but does not take any more aspirin products in addition.  Patient Active Problem List   Diagnosis Date Noted  . Unspecified glaucoma 06/24/2017  . Body mass index (BMI) of 24.0-24.9 in adult 06/24/2017  . Diverticulosis 03/08/2017  . Vitamin D deficiency 05/29/2016  . Intraocular pressure increase 07/04/2015  . Bilateral tinnitus 07/04/2015  . Benign essential HTN 12/31/2014  . Carpal tunnel syndrome 12/31/2014  . Dyslipidemia 12/31/2014  . Ovarian failure 12/31/2014  . Osteopenia 12/31/2014  . Allergic rhinitis 12/31/2014  . Arthritis of knee, degenerative 12/31/2014  . Acquired trigger finger 12/31/2014  . Cataracts,  bilateral 12/31/2014  . Fasting hyperglycemia 12/31/2014  . LBP (low back pain) 05/27/2007      Current Outpatient Medications:  .  alendronate (FOSAMAX) 70 MG tablet, TAKE 1 TABLET EVERY 7 DAYS ON AN EMPTY STOMACH  WITH  FULL  GLASS  OF  WATER, Disp: 12 tablet, Rfl: 3 .  aspirin EC 81 MG tablet, Take 1 tablet (81 mg total) by mouth daily., Disp: 30 tablet, Rfl: 0 .  Cholecalciferol (VITAMIN D) 2000 units CAPS, Take 1 capsule (2,000 Units total) by mouth daily., Disp: 30 capsule, Rfl: 0 .  MULTIPLE VITAMINS-MINERALS ER PO, Take 1 tablet by mouth daily., Disp: , Rfl:  .  Nutritional Supplements (ESTROVEN PM PO), Take by mouth daily., Disp: , Rfl:  .  rosuvastatin (CRESTOR) 20 MG tablet, Take 1 tablet (20 mg total) by mouth daily., Disp: 90 tablet, Rfl: 1 .  timolol (TIMOPTIC) 0.5 % ophthalmic solution, Place 1 drop into both eyes 2 (two) times daily. , Disp: , Rfl:  .  traZODone (DESYREL) 50 MG tablet, Take 0.5-1 tablets (25-50 mg total) by mouth at bedtime as needed for sleep., Disp: 90 tablet, Rfl: 0 .  valsartan (DIOVAN) 80 MG tablet, Take 1 tablet (80 mg total) by mouth daily., Disp: 90 tablet, Rfl: 1   Allergies  Allergen Reactions  . Lipitor [Atorvastatin] Other (See Comments)    Elevation of LFT's  Past Surgical History:  Procedure Laterality Date  . TRIGGER FINGER RELEASE    . TUBAL LIGATION       Family History  Problem Relation Age of Onset  . Heart disease Mother   . CAD Father   . Heart attack Father 50  . Cancer Brother   . Heart disease Brother   . Breast cancer Niece        sisters daughter     Social History   Socioeconomic History  . Marital status: Widowed    Spouse name: Bethann Berkshire  . Number of children: 4  . Years of education: Not on file  . Highest education level: 12th grade  Occupational History  . Occupation: Retired  Tobacco Use  . Smoking status: Never Smoker  . Smokeless tobacco: Never Used  . Tobacco comment: smoking cessation  materials not required  Substance and Sexual Activity  . Alcohol use: No    Alcohol/week: 0.0 standard drinks  . Drug use: No  . Sexual activity: Not Currently  Other Topics Concern  . Not on file  Social History Narrative   Husband diagnosed with lung cancer Fall of 2019, he died 2018/10/30  One child lives in town    Social Determinants of Health   Financial Resource Strain: Low Risk   . Difficulty of Paying Living Expenses: Not hard at all  Food Insecurity: No Food Insecurity  . Worried About Programme researcher, broadcasting/film/video in the Last Year: Never true  . Ran Out of Food in the Last Year: Never true  Transportation Needs: No Transportation Needs  . Lack of Transportation (Medical): No  . Lack of Transportation (Non-Medical): No  Physical Activity: Insufficiently Active  . Days of Exercise per Week: 3 days  . Minutes of Exercise per Session: 20 min  Stress: No Stress Concern Present  . Feeling of Stress : Only a little  Social Connections: Slightly Isolated  . Frequency of Communication with Friends and Family: More than three times a week  . Frequency of Social Gatherings with Friends and Family: More than three times a week  . Attends Religious Services: More than 4 times per year  . Active Member of Clubs or Organizations: Yes  . Attends Banker Meetings: More than 4 times per year  . Marital Status: Widowed  Intimate Partner Violence: Not At Risk  . Fear of Current or Ex-Partner: No  . Emotionally Abused: No  . Physically Abused: No  . Sexually Abused: No    With staff assistance, above reviewed with the patient today.  ROS: As per HPI, otherwise no specific complaints on a limited and focused system review   No results found for this or any previous visit (from the past 72 hour(s)).   PHQ2/9: Depression screen [facility] 2/9 09/03/2019 08/26/2019 07/27/2019 05/15/2019 01/19/2019  Decreased Interest 0 0 0 0 0  Down, Depressed, Hopeless 0 0 0 0 0  PHQ - 2 Score 0 0 0 0 0   Altered sleeping 0 - 0 - 0  Tired, decreased energy 0 - 0 - 0  Change in appetite 0 - 0 - 0  Feeling bad or failure about yourself  0 - 0 - 0  Trouble concentrating 0 - 0 - 0  Moving slowly or fidgety/restless 0 - 0 - 0  Suicidal thoughts 0 - 0 - 0  PHQ-9 Score 0 - 0 - 0  Difficult doing work/chores Not difficult at all - Not difficult at all - -  PHQ-2/9 Result is neg  Fall Risk: Fall Risk  09/03/2019 08/26/2019 07/27/2019 05/15/2019 01/19/2019  Falls in the past year? 0 0 0 0 0  Number falls in past yr: 0 - 0 - 0  Injury with Fall? 0 - 0 - 0  Follow up - Falls prevention discussed - Falls prevention discussed -      Objective:   Vitals:   09/03/19 1107  BP: 120/74  Pulse: 64  Resp: 16  Temp: 97.8 F (36.6 C)  TempSrc: Temporal  SpO2: 98%  Weight: 146 lb 9.6 oz (66.5 kg)  Height: 5\' 5"  (1.651 m)    Body mass index is 24.4 kg/m.  Physical Exam   NAD, masked, pleasant HEENT - /AT, sclera anicteric,conj - non-inj'ed, mild cerumen was noted in the external auditory canals, left greater than right and very distal in the canals with no impaction, and TMs able to be visualized.  TM's clear bilat, nontender with tugging on the lobes bilaterally.  Pharynx clear.  Hearing grossly intact in the office. Neck - supple, no adenopathy, no TM, carotids 2+ and = without bruits Car - RRR without m/g/r Pulm- RR and effort normal at rest, CTA without wheeze or rales Ext - no LE edema,  Neuro/psychiatric - affect was not flat, appropriate with conversation  Grossly non-focal - good strength on testing extremities, Romberg neg, could balance on one foot, could do tandem walk fairly well,   Speech and gait are normal   Results for orders placed or performed in visit on 02/06/19  Cologuard  Result Value Ref Range   Cologuard Negative Negative       Assessment & Plan:    1. Sensation of fullness in ear, bilateral More of a feeling of hearing her heartbeat at times.  No worsening of  her chronic tinnitus, no diminished hearing concerns, no infectious concerns.  Very mild cerumen in the distal canals, not impacted, and do not feel trying to flush out with water the best approach presently. Agreed to continue to monitor presently, and if developing more concerning symptoms, or just worsening over time, she will follow-up.  2. Bilateral tinnitus As above, no worsening in recent past  3. Benign essential HTN BP good on check today       Towanda Malkin, MD 09/03/19 11:35 AM

## 2019-09-11 ENCOUNTER — Other Ambulatory Visit: Payer: Self-pay | Admitting: Family Medicine

## 2019-09-23 ENCOUNTER — Other Ambulatory Visit: Payer: Self-pay | Admitting: Family Medicine

## 2019-09-23 DIAGNOSIS — F5101 Primary insomnia: Secondary | ICD-10-CM

## 2019-09-25 ENCOUNTER — Other Ambulatory Visit: Payer: Self-pay

## 2019-09-25 ENCOUNTER — Ambulatory Visit (INDEPENDENT_AMBULATORY_CARE_PROVIDER_SITE_OTHER): Payer: Medicare PPO

## 2019-09-25 DIAGNOSIS — I10 Essential (primary) hypertension: Secondary | ICD-10-CM

## 2019-09-25 DIAGNOSIS — E785 Hyperlipidemia, unspecified: Secondary | ICD-10-CM

## 2019-09-25 NOTE — Patient Instructions (Addendum)
Thank you for allowing the Chronic Care Management team to participate in your care.  Goals Addressed            This Visit's Progress   . Improve ability to self-manage blood pressure and dyslipidemia.   On track    Current Barriers:  . Chronic Disease Management support and education needs related to dyslipidemia and hypertension management  Case Manager Clinical Goal(s):  Marland Kitchen Over the next 90 days, patient will continue taking medications as prescribed. . Over the next 90 days, patient will attend all scheduled medical appointments. . Over the next 90 days, patient will continue monitoring blood pressure and record readings. . Over the next 90 days, patient will increase compliance with heart health/cardiac prudent diet.  Interventions:  . Reviewed medications and discussed indications for use. Patient encouraged to take all medications as prescribed and notify MD if unable tolerate regimen. Patient denied concerns regarding medication adherence or prescription costs. Anticipates receiving initial Covid-19 vaccine on 08/31/19. Marland Kitchen Discussed current blood pressure readings. Patient systolic readings have ranged from 120 to low 140's. Reports diastolic readings remain in the 70's.  . Reviewed education regarding blood pressure parameters and indications for notifying MD. Patient encouraged to maintain a BP log to identify trends. . Reviewed education regarding nutrition and dietary intake. Patient encouraged to increase compliance with heart healthy/cardiac prudent diet. . Patient encouraged to continue mild exercises/activities and walks as tolerated. . Reviewed pending provider appointments. Patient encouraged to attend appointments as scheduled to prevent delays in care. Attended appointment with primary care provider as scheduled on 09/03/19. Denies concerns regarding transportation. . Discussed plans for ongoing care management follow up and provided patient with direct contact information  for care management team   Patient Self Care Activities:  . Self administers medications as prescribed . Attends scheduled provider appointments . Calls pharmacy for medication refills . Performs ADL's independently . Performs IADL's independently . Calls provider office for new concerns or questions  Please see past updates related to this goal by clicking on the "Past Updates" button in the selected goal           Yvonne Andrews verbalized understanding of instructions provided during the telephonic outreach today. She did not require a mailed/printed copy of the instructions  The care management team will reach out to Yvonne Andrews again within the next 30 days.    [facility] [phone]

## 2019-09-25 NOTE — Chronic Care Management (AMB) (Signed)
Chronic Care Management   Follow Up Note   09/25/2019 Name: EMMALIE HAIGH MRN: [id number] DOB: 11/18/1940  Primary Care Provider: Steele Sizer, MD Reason for referral : Chronic Care Management   Yvonne Andrews is a 79 y.o. year old female who is a primary care patient of Steele Sizer, MD. She is currently engaged with the Chronic Care Management team.   Mrs. Metzger reports receiving the initial Covid-19 vaccine dose on in February. She is scheduled to receive the second dose on 09/30/19.  Review of Mrs. Bordeau status, including review of consultants reports, relevant labs and test results was conducted today. Collaboration with appropriate care team members was performed as part of the comprehensive evaluation and provision of chronic care management services.    SDOH (Social Determinants of Health) assessments performed: Yes See Care Plan activities for detailed interventions related to [facility])     Outpatient Encounter Medications as of 09/25/2019  Medication Sig Note  . alendronate (FOSAMAX) 70 MG tablet TAKE 1 TABLET EVERY 7 DAYS ON AN EMPTY STOMACH  WITH  FULL  GLASS  OF  WATER   . aspirin EC 81 MG tablet Take 1 tablet (81 mg total) by mouth daily.   . Cholecalciferol (VITAMIN D) 2000 units CAPS Take 1 capsule (2,000 Units total) by mouth daily.   . MULTIPLE VITAMINS-MINERALS ER PO Take 1 tablet by mouth daily. 12/31/2014: Received from: [facility]: Take by mouth.  . Nutritional Supplements (ESTROVEN PM PO) Take by mouth daily.   . rosuvastatin (CRESTOR) 20 MG tablet Take 1 tablet (20 mg total) by mouth daily.   . timolol (TIMOPTIC) 0.5 % ophthalmic solution Place 1 drop into both eyes 2 (two) times daily.    . traZODone (DESYREL) 50 MG tablet Take 0.5-1 tablets (25-50 mg total) by mouth at bedtime as needed for sleep.   . valsartan (DIOVAN) 80 MG tablet Take 1 tablet (80 mg total) by mouth daily.    No facility-administered encounter medications on file  as of 09/25/2019.     Objective:   BP Readings from Last 3 Encounters:  09/03/19 120/74  07/27/19 133/75  01/19/19 (!) 144/76     Goals Addressed            This Visit's Progress   . Improve ability to self-manage blood pressure and dyslipidemia.   On track    Current Barriers:  . Chronic Disease Management support and education needs related to dyslipidemia and hypertension management  Case Manager Clinical Goal(s):  Marland Kitchen Over the next 90 days, patient will continue taking medications as prescribed. . Over the next 90 days, patient will attend all scheduled medical appointments. . Over the next 90 days, patient will continue monitoring blood pressure and record readings. . Over the next 90 days, patient will increase compliance with heart health/cardiac prudent diet.  Interventions:  . Reviewed medications and discussed indications for use. Patient encouraged to take all medications as prescribed and notify MD if unable tolerate regimen. Patient denied concerns regarding medication adherence or prescription costs. Anticipates receiving initial Covid-19 vaccine on 08/31/19. Marland Kitchen Discussed current blood pressure readings. Patient systolic readings have ranged from 120 to low 140's. Reports diastolic readings remain in the 70's.  . Reviewed education regarding blood pressure parameters and indications for notifying MD. Patient encouraged to maintain a BP log to identify trends. . Reviewed education regarding nutrition and dietary intake. Patient encouraged to increase compliance with heart healthy/cardiac prudent diet. . Patient encouraged to  continue mild exercises/activities and walks as tolerated. . Reviewed pending provider appointments. Patient encouraged to attend appointments as scheduled to prevent delays in care. Attended appointment with primary care provider as scheduled on 09/03/19. Denies concerns regarding transportation. . Discussed plans for ongoing care management follow up and  provided patient with direct contact information for care management team   Patient Self Care Activities:  . Self administers medications as prescribed . Attends scheduled provider appointments . Calls pharmacy for medication refills . Performs ADL's independently . Performs IADL's independently . Calls provider office for new concerns or questions  Please see past updates related to this goal by clicking on the "Past Updates" button in the selected goal         PLAN The care management team will follow up with Mrs. Hobin within the next 30 days.    [facility] [phone]

## 2019-09-30 ENCOUNTER — Ambulatory Visit: Payer: Medicare PPO | Attending: Internal Medicine

## 2019-09-30 ENCOUNTER — Other Ambulatory Visit: Payer: Self-pay

## 2019-09-30 DIAGNOSIS — Z23 Encounter for immunization: Secondary | ICD-10-CM

## 2019-09-30 NOTE — Progress Notes (Signed)
   Covid-19 Vaccination Clinic  Name:  Yvonne Andrews    MRN: [id number] DOB: 04-13-1941  09/30/2019  [name] was observed post Covid-19 immunization for 15 minutes without incident. She was provided with Vaccine Information Sheet and instruction to access the V-Safe system.   [name] was instructed to call 911 with any severe reactions post vaccine: Marland Kitchen Difficulty breathing  . Swelling of face and throat  . A fast heartbeat  . A bad rash all over body  . Dizziness and weakness   Immunizations Administered    Name Date Dose VIS Date Route   Moderna COVID-19 Vaccine 09/30/2019 12:52 PM 0.5 mL 06/23/2019 Intramuscular   Manufacturer: Moderna   Lot: [id number]   NDC: 74142-395-32

## 2019-10-01 ENCOUNTER — Ambulatory Visit: Payer: Self-pay

## 2019-10-01 NOTE — Chronic Care Management (AMB) (Signed)
  Chronic Care Management   Note  10/01/2019 Name: Yvonne Andrews MRN: [id number] DOB: 01-04-1941    Returned call to [name]. She received the second Covid-19 vaccination dose yesterday and wanted to discuss her current symptoms.  She reports fatigue and a low-grade fever. Reports taking Tylenol as recommended and increasing fluid intake. We discussed the common side effects and indications for seeking immediate medical attention. [name] verbalized understanding. Agreed to continue Tylenol regimen as recommended for the remainder of the day.    Follow up plan: Will follow up with [name] tomorrow.   [facility] [phone]

## 2019-10-02 ENCOUNTER — Ambulatory Visit: Payer: Self-pay

## 2019-10-02 ENCOUNTER — Telehealth: Payer: Self-pay

## 2019-10-02 NOTE — Telephone Encounter (Signed)
Copied from CRM [id number]. Topic: General - Other >> Oct 01, 2019  3:32 PM Tamela Oddi wrote: Reason for CRM: Patient called to ask the nurse to call her regarding her low grade fever since getting the COVID vaccine.  CB# [phone]

## 2019-10-16 ENCOUNTER — Ambulatory Visit: Payer: Self-pay

## 2019-10-16 DIAGNOSIS — I10 Essential (primary) hypertension: Secondary | ICD-10-CM | POA: Diagnosis not present

## 2019-10-16 DIAGNOSIS — E785 Hyperlipidemia, unspecified: Secondary | ICD-10-CM

## 2019-10-16 NOTE — Patient Instructions (Signed)
Thank you for allowing the Chronic Care Management team to participate in your care.  Goals Addressed            This Visit's Progress   . Improve ability to self-manage blood pressure and dyslipidemia.   On track    Current Barriers:  . Chronic Disease Management support and education needs related to dyslipidemia and hypertension management  Case Manager Clinical Goal(s):  Marland Kitchen Over the next 90 days, patient will continue taking medications as prescribed. . Over the next 90 days, patient will attend all scheduled medical appointments. . Over the next 90 days, patient will continue monitoring blood pressure and record readings. . Over the next 90 days, patient will increase compliance with heart health/cardiac prudent diet.  Interventions:  . Reviewed medications and discussed indications for use. Patient encouraged to take all medications as prescribed and notify MD if unable tolerate regimen.  . Discussed current blood pressure readings. Reviewed education regarding blood pressure parameters and indications for notifying MD. Patient encouraged to maintain a BP log to identify trends.  Reports readings have been within range. . Reviewed education regarding nutrition and dietary intake. Patient encouraged to increase compliance with heart healthy/cardiac prudent diet. . Patient encouraged to continue mild exercises/activities and walks as tolerated. Reports engaging in mild activities and walking regularly. . Reviewed pending provider appointments. Patient encouraged to attend appointments as scheduled to prevent delays in care. Pending Medicare annual wellness outreach on 10/20/19. Marland Kitchen Discussed plans for ongoing care management follow up and provided patient with direct contact information for care management team.   Patient Self Care Activities:  . Self administers medications as prescribed . Attends scheduled provider appointments . Calls pharmacy for medication refills . Performs ADL's  independently . Performs IADL's independently . Calls provider office for new concerns or questions  Please see past updates related to this goal by clicking on the "Past Updates" button in the selected goal          Yvonne Andrews verbalized understanding of the instructions provided during the telephonic outreach today. She did not request a mailed/ printed copy of the instructions.   The care management team will follow-up with Yvonne Andrews next month.   [facility] Care Management [phone]

## 2019-10-16 NOTE — Chronic Care Management (AMB) (Signed)
Chronic Care Management   Follow Up Note   10/16/2019 Name: Yvonne Andrews MRN: [id number] DOB: Mar 31, 1941  Primary Care Provider: Alba Cory, MD Reason for referral : Chronic Care Management   Yvonne Andrews is a 79 y.o. year old female who is a primary care patient of Yvonne Cory, MD. She is currently engaged with the chronic care management team. A routine telephonic outreach was conducted today.  She completed the 2-dose Covid-19 vaccination series.  Review of Yvonne Andrews status, including review of consultants reports, relevant labs and test results was conducted today. Collaboration with appropriate care team members was performed as part of the comprehensive evaluation and provision of chronic care management services.      Outpatient Encounter Medications as of 10/16/2019  Medication Sig Note  . alendronate (FOSAMAX) 70 MG tablet TAKE 1 TABLET EVERY 7 DAYS ON AN EMPTY STOMACH  WITH  FULL  GLASS  OF  WATER   . aspirin EC 81 MG tablet Take 1 tablet (81 mg total) by mouth daily.   . Cholecalciferol (VITAMIN D) 2000 units CAPS Take 1 capsule (2,000 Units total) by mouth daily.   . MULTIPLE VITAMINS-MINERALS ER PO Take 1 tablet by mouth daily. 12/31/2014: Received from: [employer] Received Sig: Take by mouth.  . Nutritional Supplements (ESTROVEN PM PO) Take by mouth daily.   . rosuvastatin (CRESTOR) 20 MG tablet Take 1 tablet (20 mg total) by mouth daily.   . timolol (TIMOPTIC) 0.5 % ophthalmic solution Place 1 drop into both eyes 2 (two) times daily.    . traZODone (DESYREL) 50 MG tablet Take 0.5-1 tablets (25-50 mg total) by mouth at bedtime as needed for sleep.   . valsartan (DIOVAN) 80 MG tablet Take 1 tablet (80 mg total) by mouth daily.    No facility-administered encounter medications on file as of 10/16/2019.     Objective:   BP Readings from Last 3 Encounters:  09/03/19 120/74  07/27/19 133/75  01/19/19 (!) 144/76    Goals Addressed            This Visit's Progress   . Improve ability to self-manage blood pressure and dyslipidemia.   On track    Current Barriers:  . Chronic Disease Management support and education needs related to dyslipidemia and hypertension management  Case Manager Clinical Goal(s):  Marland Kitchen Over the next 90 days, patient will continue taking medications as prescribed. . Over the next 90 days, patient will attend all scheduled medical appointments. . Over the next 90 days, patient will continue monitoring blood pressure and record readings. . Over the next 90 days, patient will increase compliance with heart health/cardiac prudent diet.  Interventions:  . Reviewed medications and discussed indications for use. Patient encouraged to take all medications as prescribed and notify MD if unable tolerate regimen.  . Discussed current blood pressure readings. Reviewed education regarding blood pressure parameters and indications for notifying MD. Patient encouraged to maintain a BP log to identify trends.  Reports readings have been within range. . Reviewed education regarding nutrition and dietary intake. Patient encouraged to increase compliance with heart healthy/cardiac prudent diet. . Patient encouraged to continue mild exercises/activities and walks as tolerated. Reports engaging in mild activities and walking regularly. . Reviewed pending provider appointments. Patient encouraged to attend appointments as scheduled to prevent delays in care. Pending Medicare annual wellness outreach on 10/20/19. Marland Kitchen Discussed plans for ongoing care management follow up and provided patient with direct contact information for care management  team.   Patient Self Care Activities:  . Self administers medications as prescribed . Attends scheduled provider appointments . Calls pharmacy for medication refills . Performs ADL's independently . Performs IADL's independently . Calls provider office for new concerns or questions  Please  see past updates related to this goal by clicking on the "Past Updates" button in the selected goal          PLAN The care management team will follow-up with Yvonne Andrews next month.   [facility] Care [facility] [phone]

## 2019-10-20 ENCOUNTER — Ambulatory Visit (INDEPENDENT_AMBULATORY_CARE_PROVIDER_SITE_OTHER): Payer: Medicare PPO

## 2019-10-20 VITALS — BP 137/74 | HR 58 | Temp 96.4°F | Ht 65.0 in | Wt 143.0 lb

## 2019-10-20 DIAGNOSIS — Z0000 Encounter for general adult medical examination without abnormal findings: Secondary | ICD-10-CM | POA: Diagnosis not present

## 2019-10-20 NOTE — Patient Instructions (Signed)
Yvonne Andrews , Thank you for taking time to come for your Medicare Wellness Visit. I appreciate your ongoing commitment to your health goals. Please review the following plan we discussed and let me know if I can assist you in the future.   Screening recommendations/referrals: Colonoscopy: no longer required Mammogram: done 02/24/19 Bone Density: done 02/19/18 Recommended yearly ophthalmology/optometry visit for glaucoma screening and checkup Recommended yearly dental visit for hygiene and checkup  Vaccinations: Influenza vaccine: done 03/25/19 Pneumococcal vaccine: done 09/20/16 Tdap vaccine: done 09/27/10 Shingles vaccine: Shingrix discussed. Please contact your pharmacy for coverage information.  Covid-19: done 08/31/19 & 09/30/19  Advanced directives: Please bring a copy of your health care power of attorney and living will to the office at your convenience.  Conditions/risks identified: Keep up the great work!  Next appointment: Please follow up in one year for your Medicare Annual Wellness visit.     Preventive Care 33 Years and Older, Female Preventive care refers to lifestyle choices and visits with your health care provider that can promote health and wellness. What does preventive care include?  A yearly physical exam. This is also called an annual well check.  Dental exams once or twice a year.  Routine eye exams. Ask your health care provider how often you should have your eyes checked.  Personal lifestyle choices, including:  Daily care of your teeth and gums.  Regular physical activity.  Eating a healthy diet.  Avoiding tobacco and drug use.  Limiting alcohol use.  Practicing safe sex.  Taking low-dose aspirin every day.  Taking vitamin and mineral supplements as recommended by your health care provider. What happens during an annual well check? The services and screenings done by your health care provider during your annual well check will depend on your age,  overall health, lifestyle risk factors, and family history of disease. Counseling  Your health care provider may ask you questions about your:  Alcohol use.  Tobacco use.  Drug use.  Emotional well-being.  Home and relationship well-being.  Sexual activity.  Eating habits.  History of falls.  Memory and ability to understand (cognition).  Work and work Astronomer.  Reproductive health. Screening  You may have the following tests or measurements:  Height, weight, and BMI.  Blood pressure.  Lipid and cholesterol levels. These may be checked every 5 years, or more frequently if you are over 29 years old.  Skin check.  Lung cancer screening. You may have this screening every year starting at age 61 if you have a 30-pack-year history of smoking and currently smoke or have quit within the past 15 years.  Fecal occult blood test (FOBT) of the stool. You may have this test every year starting at age 40.  Flexible sigmoidoscopy or colonoscopy. You may have a sigmoidoscopy every 5 years or a colonoscopy every 10 years starting at age 34.  Hepatitis C blood test.  Hepatitis B blood test.  Sexually transmitted disease (STD) testing.  Diabetes screening. This is done by checking your blood sugar (glucose) after you have not eaten for a while (fasting). You may have this done every 1-3 years.  Bone density scan. This is done to screen for osteoporosis. You may have this done starting at age 104.  Mammogram. This may be done every 1-2 years. Talk to your health care provider about how often you should have regular mammograms. Talk with your health care provider about your test results, treatment options, and if necessary, the need for more tests.  Vaccines  Your health care provider may recommend certain vaccines, such as:  Influenza vaccine. This is recommended every year.  Tetanus, diphtheria, and acellular pertussis (Tdap, Td) vaccine. You may need a Td booster every 10  years.  Zoster vaccine. You may need this after age 11.  Pneumococcal 13-valent conjugate (PCV13) vaccine. One dose is recommended after age 54.  Pneumococcal polysaccharide (PPSV23) vaccine. One dose is recommended after age 33. Talk to your health care provider about which screenings and vaccines you need and how often you need them. This information is not intended to replace advice given to you by your health care provider. Make sure you discuss any questions you have with your health care provider. Document Released: 08/05/2015 Document Revised: 03/28/2016 Document Reviewed: 05/10/2015 Elsevier Interactive Patient Education  2017 [employer] Prevention in the Home Falls can cause injuries. They can happen to people of all ages. There are many things you can do to make your home safe and to help prevent falls. What can I do on the outside of my home?  Regularly fix the edges of walkways and driveways and fix any cracks.  Remove anything that might make you trip as you walk through a door, such as a raised step or threshold.  Trim any bushes or trees on the path to your home.  Use bright outdoor lighting.  Clear any walking paths of anything that might make someone trip, such as rocks or tools.  Regularly check to see if handrails are loose or broken. Make sure that both sides of any steps have handrails.  Any raised decks and porches should have guardrails on the edges.  Have any leaves, snow, or ice cleared regularly.  Use sand or salt on walking paths during winter.  Clean up any spills in your garage right away. This includes oil or grease spills. What can I do in the bathroom?  Use night lights.  Install grab bars by the toilet and in the tub and shower. Do not use towel bars as grab bars.  Use non-skid mats or decals in the tub or shower.  If you need to sit down in the shower, use a plastic, non-slip stool.  Keep the floor dry. Clean up any water that  spills on the floor as soon as it happens.  Remove soap buildup in the tub or shower regularly.  Attach bath mats securely with double-sided non-slip rug tape.  Do not have throw rugs and other things on the floor that can make you trip. What can I do in the bedroom?  Use night lights.  Make sure that you have a light by your bed that is easy to reach.  Do not use any sheets or blankets that are too big for your bed. They should not hang down onto the floor.  Have a firm chair that has side arms. You can use this for support while you get dressed.  Do not have throw rugs and other things on the floor that can make you trip. What can I do in the kitchen?  Clean up any spills right away.  Avoid walking on wet floors.  Keep items that you use a lot in easy-to-reach places.  If you need to reach something above you, use a strong step stool that has a grab bar.  Keep electrical cords out of the way.  Do not use floor polish or wax that makes floors slippery. If you must use wax, use non-skid floor wax.  Do not have throw rugs and other things on the floor that can make you trip. What can I do with my stairs?  Do not leave any items on the stairs.  Make sure that there are handrails on both sides of the stairs and use them. Fix handrails that are broken or loose. Make sure that handrails are as long as the stairways.  Check any carpeting to make sure that it is firmly attached to the stairs. Fix any carpet that is loose or worn.  Avoid having throw rugs at the top or bottom of the stairs. If you do have throw rugs, attach them to the floor with carpet tape.  Make sure that you have a light switch at the top of the stairs and the bottom of the stairs. If you do not have them, ask someone to add them for you. What else can I do to help prevent falls?  Wear shoes that:  Do not have high heels.  Have rubber bottoms.  Are comfortable and fit you well.  Are closed at the  toe. Do not wear sandals.  If you use a stepladder:  Make sure that it is fully opened. Do not climb a closed stepladder.  Make sure that both sides of the stepladder are locked into place.  Ask someone to hold it for you, if possible.  Clearly mark and make sure that you can see:  Any grab bars or handrails.  First and last steps.  Where the edge of each step is.  Use tools that help you move around (mobility aids) if they are needed. These include:  Canes.  Walkers.  Scooters.  Crutches.  Turn on the lights when you go into a dark area. Replace any light bulbs as soon as they burn out.  Set up your furniture so you have a clear path. Avoid moving your furniture around.  If any of your floors are uneven, fix them.  If there are any pets around you, be aware of where they are.  Review your medicines with your doctor. Some medicines can make you feel dizzy. This can increase your chance of falling. Ask your doctor what other things that you can do to help prevent falls. This information is not intended to replace advice given to you by your health care provider. Make sure you discuss any questions you have with your health care provider. Document Released: 05/05/2009 Document Revised: 12/15/2015 Document Reviewed: 08/13/2014 Elsevier Interactive Patient Education  2017 [employer].

## 2019-10-20 NOTE — Progress Notes (Signed)
Subjective:   Yvonne Andrews is a 79 y.o. female who presents for Medicare Annual (Subsequent) preventive examination.  Virtual Visit via Telephone Note  I connected with Yvonne Andrews on 10/20/19 at  8:40 AM EDT by telephone and verified that I am speaking with the correct person using two identifiers.  Medicare Annual Wellness visit completed telephonically due to Covid-19 pandemic.   Location: Patient: home Provider: office   I discussed the limitations, risks, security and privacy concerns of performing an evaluation and management service by telephone and the availability of in person appointments. The patient expressed understanding and agreed to proceed.  Some vital signs may be absent or patient reported.   Reather LittlerKasey Brixon Zhen, LPN    Review of Systems:   Cardiac Risk Factors include: advanced age (>2355men, 18>65 women);hypertension;dyslipidemia     Objective:     Vitals: BP 137/74   Pulse (!) 58   Temp (!) 96.4 F (35.8 C)   Ht 5\' 5"  (1.651 m)   Wt 143 lb (64.9 kg)   BMI 23.80 kg/m   Body mass index is 23.8 kg/m.  Advanced Directives 10/20/2019 09/09/2018 09/06/2017 03/08/2017 01/21/2017 09/20/2016 05/22/2016  Does Patient Have a Medical Advance Directive? Yes Yes No No No No No  Type of Estate agentAdvance Directive Healthcare Power of AlianzaAttorney;Living will Living will;Healthcare Power of Attorney - - - - -  Copy of Healthcare Power of Attorney in Chart? No - copy requested No - copy requested - - - - -  Would patient like information on creating a medical advance directive? - - Yes (MAU/Ambulatory/Procedural Areas - Information given) - - - No - patient declined information    Tobacco Social History   Tobacco Use  Smoking Status Never Smoker  Smokeless Tobacco Never Used  Tobacco Comment   smoking cessation materials not required     Counseling given: Not Answered Comment: smoking cessation materials not required   Clinical Intake:  Pre-visit preparation completed:  Yes  Pain : 0-10 Pain Score: 5  Pain Type: Acute pain Pain Location: Back Pain Orientation: Left, Mid Pain Descriptors / Indicators: Aching, Sore Pain Onset: In the past 7 days     BMI - recorded: 23.8 Nutritional Status: BMI of 19-24  Normal Nutritional Risks: None Diabetes: No  How often do you need to have someone help you when you read instructions, pamphlets, or other written materials from your doctor or pharmacy?: 1 - Never  Interpreter Needed?: No  Information entered by :: Reather LittlerKasey Malena Timpone LPN  Past Medical History:  Diagnosis Date  . Allergy   . Bilateral tinnitus   . Cataracts, bilateral   . Diverticulitis   . Hyperlipidemia   . Hypertension   . Macular degeneration due to cyst or hole, left 2017  . Osteopenia   . Trigger finger of right hand   . Unspecified glaucoma    Past Surgical History:  Procedure Laterality Date  . TRIGGER FINGER RELEASE    . TUBAL LIGATION     Family History  Problem Relation Age of Onset  . Heart disease Mother   . CAD Father   . Heart attack Father [id number]  . Cancer Brother   . Heart disease Brother   . Breast cancer Niece        sisters daughter   Social History   Socioeconomic History  . Marital status: Widowed    Spouse name: Bethann BerkshireJohnny  . Number of children: 4  . Years of education: Not on file  .  Highest education level: 12th grade  Occupational History  . Occupation: Retired  Tobacco Use  . Smoking status: Never Smoker  . Smokeless tobacco: Never Used  . Tobacco comment: smoking cessation materials not required  Substance and Sexual Activity  . Alcohol use: No    Alcohol/week: 0.0 standard drinks  . Drug use: No  . Sexual activity: Not Currently  Other Topics Concern  . Not on file  Social History Narrative   Husband diagnosed with lung cancer Fall of 2019, he died 2018-10-19  One child lives in town    Social Determinants of Health   Financial Resource Strain: Low Risk   . Difficulty of Paying Living  Expenses: Not hard at all  Food Insecurity: No Food Insecurity  . Worried About Programme researcher, broadcasting/film/video in the Last Year: Never true  . Ran Out of Food in the Last Year: Never true  Transportation Needs: No Transportation Needs  . Lack of Transportation (Medical): No  . Lack of Transportation (Non-Medical): No  Physical Activity: Sufficiently Active  . Days of Exercise per Week: 5 days  . Minutes of Exercise per Session: 30 min  Stress: No Stress Concern Present  . Feeling of Stress : Not at all  Social Connections: Slightly Isolated  . Frequency of Communication with Friends and Family: More than three times a week  . Frequency of Social Gatherings with Friends and Family: More than three times a week  . Attends Religious Services: More than 4 times per year  . Active Member of Clubs or Organizations: Yes  . Attends Banker Meetings: More than 4 times per year  . Marital Status: Widowed    Outpatient Encounter Medications as of 10/20/2019  Medication Sig  . alendronate (FOSAMAX) 70 MG tablet TAKE 1 TABLET EVERY 7 DAYS ON AN EMPTY STOMACH  WITH  FULL  GLASS  OF  WATER  . aspirin EC 81 MG tablet Take 1 tablet (81 mg total) by mouth daily.  . Cholecalciferol (VITAMIN D) 2000 units CAPS Take 1 capsule (2,000 Units total) by mouth daily.  . MULTIPLE VITAMINS-MINERALS ER PO Take 1 tablet by mouth daily.  . Nutritional Supplements (ESTROVEN PM PO) Take by mouth daily.  . rosuvastatin (CRESTOR) 20 MG tablet Take 1 tablet (20 mg total) by mouth daily.  . timolol (TIMOPTIC) 0.5 % ophthalmic solution Place 1 drop into both eyes 2 (two) times daily.   . traZODone (DESYREL) 50 MG tablet Take 0.5-1 tablets (25-50 mg total) by mouth at bedtime as needed for sleep.  . valsartan (DIOVAN) 80 MG tablet Take 1 tablet (80 mg total) by mouth daily.   No facility-administered encounter medications on file as of 10/20/2019.    Activities of Daily Living In your present state of health, do you  have any difficulty performing the following activities: 10/20/2019 09/03/2019  Hearing? N N  Comment declines hearing aids -  Vision? N N  Difficulty concentrating or making decisions? Y Y  Comment - a little bit trouble remembering  Walking or climbing stairs? N N  Dressing or bathing? N N  Doing errands, shopping? N N  Preparing Food and eating ? N -  Using the Toilet? N -  In the past six months, have you accidently leaked urine? N -  Do you have problems with loss of bowel control? N -  Managing your Medications? N -  Managing your Finances? N -  Housekeeping or managing your Housekeeping? N -  Some recent data might be hidden    Patient Care Team: Steele Sizer, MD as PCP - General (Family Medicine) [place], [place] as Consulting Physician (Optometry) Neldon Labella, RN as Case Manager    Assessment:   This is a routine wellness examination for [facility]  Exercise Activities and Dietary recommendations Current Exercise Habits: Home exercise routine, Type of exercise: walking, Time (Minutes): 30, Frequency (Times/Week): 5, Weekly Exercise (Minutes/Week): 150, Intensity: Moderate, Exercise limited by: None identified  Goals    . Exercise 150 min/wk Moderate Activity     Recommend to exercise for at least 150 minutes per week.    . Improve ability to self-manage blood pressure and dyslipidemia.     Current Barriers:  . Chronic Disease Management support and education needs related to dyslipidemia and hypertension management  Case Manager Clinical Goal(s):  Marland Kitchen Over the next 90 days, patient will continue taking medications as prescribed. . Over the next 90 days, patient will attend all scheduled medical appointments. . Over the next 90 days, patient will continue monitoring blood pressure and record readings. . Over the next 90 days, patient will increase compliance with heart health/cardiac prudent diet.  Interventions:  . Reviewed medications and discussed  indications for use. Patient encouraged to take all medications as prescribed and notify MD if unable tolerate regimen.  . Discussed current blood pressure readings. Reviewed education regarding blood pressure parameters and indications for notifying MD. Patient encouraged to maintain a BP log to identify trends.  Reports readings have been within range. . Reviewed education regarding nutrition and dietary intake. Patient encouraged to increase compliance with heart healthy/cardiac prudent diet. . Patient encouraged to continue mild exercises/activities and walks as tolerated. Reports engaging in mild activities and walking regularly. . Reviewed pending provider appointments. Patient encouraged to attend appointments as scheduled to prevent delays in care. Pending Medicare annual wellness outreach on 10/20/19. Marland Kitchen Discussed plans for ongoing care management follow up and provided patient with direct contact information for care management team.   Patient Self Care Activities:  . Self administers medications as prescribed . Attends scheduled provider appointments . Calls pharmacy for medication refills . Performs ADL's independently . Performs IADL's independently . Calls provider office for new concerns or questions  Please see past updates related to this goal by clicking on the "Past Updates" button in the selected goal         Fall Risk Fall Risk  10/20/2019 09/25/2019 09/03/2019 08/26/2019 07/27/2019  Falls in the past year? 0 0 0 0 0  Number falls in past yr: 0 - 0 - 0  Injury with Fall? 0 - 0 - 0  Risk for fall due to : No Fall Risks - - - -  Follow up Falls prevention discussed Falls prevention discussed - Falls prevention discussed -   FALL RISK PREVENTION PERTAINING TO THE HOME:  Any stairs in or around the home? Yes  If so, do they handrails? Yes   Home free of loose throw rugs in walkways, pet beds, electrical cords, etc? Yes  Adequate lighting in your home to reduce risk of falls?  Yes   ASSISTIVE DEVICES UTILIZED TO PREVENT FALLS:  Life alert? No  Use of a cane, walker or w/c? No  Grab bars in the bathroom? No  Shower chair or bench in shower? No  Elevated toilet seat or a handicapped toilet? No   DME ORDERS:  DME order needed?  No   TIMED UP AND GO:  Was  the test performed? No . Telephonic visit.   Education: Fall risk prevention has been discussed.  Intervention(s) required? No   Depression Screen PHQ 2/9 Scores 10/20/2019 09/25/2019 09/03/2019 08/26/2019  PHQ - 2 Score 0 0 0 0  PHQ- 9 Score - - 0 -     Cognitive Function     6CIT Screen 10/20/2019 09/09/2018 09/06/2017  What Year? 0 points 0 points 0 points  What month? 0 points 0 points 0 points  What time? 0 points 0 points 0 points  Count back from 20 0 points 0 points 0 points  Months in reverse 0 points 0 points 0 points  Repeat phrase 2 points 4 points 4 points  Total Score 2 4 4     Immunization History  Administered Date(s) Administered  . DT (Pediatric) 05/27/2007  . Fluad Quad(high Dose 65+) 03/25/2019  . Hepatitis B 10/30/2007, 12/03/2007, 05/04/2008  . Influenza, High Dose Seasonal PF 04/23/2017, 04/04/2018  . Influenza, Seasonal, Injecte, Preservative Fre 05/05/2013  . Influenza,inj,Quad PF,6+ Mos 05/04/2015  . Influenza-Unspecified 05/23/2014, 03/17/2016, 04/23/2017  . Moderna SARS-COVID-2 Vaccination 08/31/2019, 09/30/2019  . Pneumococcal Conjugate-13 09/17/2014  . Pneumococcal Polysaccharide-23 05/03/2008, 09/20/2016  . Tdap 09/27/2010  . Zoster 04/03/2012    Qualifies for Shingles Vaccine? Yes  Zostavax completed 2013. Due for Shingrix. Education has been provided regarding the importance of this vaccine. Pt has been advised to call insurance company to determine out of pocket expense. Advised may also receive vaccine at local pharmacy or Health Dept. Verbalized acceptance and understanding.  Tdap: Up to date  Flu Vaccine: Up to date  Pneumococcal Vaccine: Up to  date   Screening Tests Health Maintenance  Topic Date Due  . MAMMOGRAM  02/24/2020  . TETANUS/TDAP  09/26/2020  . INFLUENZA VACCINE  Completed  . DEXA SCAN  Completed  . PNA vac Low Risk Adult  Completed    Cancer Screenings:  Colorectal Screening: Completed 2010. Cologuard also completed 2020 - negative.   Mammogram: Completed 02/24/19. Repeat every year;   Bone Density: Completed 02/19/18. Results reflect OSTEOPENIA. Repeat every 2 years.   Lung Cancer Screening: (Low Dose CT Chest recommended if Age 107-80 years, 30 pack-year currently smoking OR have quit w/in 15years.) does not qualify.   Additional Screening:  Hepatitis C Screening: no longer required  Vision Screening: Recommended annual ophthalmology exams for early detection of glaucoma and other disorders of the eye. Is the patient up to date with their annual eye exam?  Yes  Who is the provider or what is the name of the office in which the pt attends annual eye exams? [facility]  Dental Screening: Recommended annual dental exams for proper oral hygiene  Community Resource Referral:  CRR required this visit?  No      Plan:     I have personally reviewed and addressed the Medicare Annual Wellness questionnaire and have noted the following in the patient's chart:  A. Medical and social history B. Use of alcohol, tobacco or illicit drugs  C. Current medications and supplements D. Functional ability and status E.  Nutritional status F.  Physical activity G. Advance directives H. List of other physicians I.  Hospitalizations, surgeries, and ER visits in previous 12 months J.  Vitals K. Screenings such as hearing and vision if needed, cognitive and depression L. Referrals and appointments   In addition, I have reviewed and discussed with patient certain preventive protocols, quality metrics, and best practice recommendations. A written personalized care plan for  preventive services as well as general  preventive health recommendations were provided to patient.   Signed,  Reather Littler, LPN  Nurse Health Advisor   Nurse Notes: none

## 2019-10-23 ENCOUNTER — Ambulatory Visit: Payer: Self-pay

## 2019-10-23 DIAGNOSIS — M25512 Pain in left shoulder: Secondary | ICD-10-CM | POA: Diagnosis not present

## 2019-10-23 DIAGNOSIS — M47812 Spondylosis without myelopathy or radiculopathy, cervical region: Secondary | ICD-10-CM | POA: Diagnosis not present

## 2019-10-23 NOTE — Telephone Encounter (Signed)
Pt. Reports 2 weeks ago started having left sided chest pain when she "moves a certain way. It hurts into my back, lasts a few seconds then goes away." "It feels like I have pulled a muscle." Denies any dizziness, shortness of breath, sweating. No cough or fever.Pain 5/10. No availability with PCP. Instructed to go to ED or UC for evaluation now. Verbalizes understanding.  Reason for Disposition . [1] Chest pain lasts > 5 minutes AND [2] occurred in past 3 days (72 hours)  Answer Assessment - Initial Assessment Questions 1. LOCATION: "Where does it hurt?"       Left side of chest and back 2. RADIATION: "Does the pain go anywhere else?" (e.g., into neck, jaw, arms, back)     Back 3. ONSET: "When did the chest pain begin?" (Minutes, hours or days)      Started 2 weeks ago 4. PATTERN "Does the pain come and go, or has it been constant since it started?"  "Does it get worse with exertion?"      Comes and goes 5. DURATION: "How long does it last" (e.g., seconds, minutes, hours)     Lasts a few seconds 6. SEVERITY: "How bad is the pain?"  (e.g., Scale 1-10; mild, moderate, or severe)    - MILD (1-3): doesn't interfere with normal activities     - MODERATE (4-7): interferes with normal activities or awakens from sleep    - SEVERE (8-10): excruciating pain, unable to do any normal activities       5 7. CARDIAC RISK FACTORS: "Do you have any history of heart problems or risk factors for heart disease?" (e.g., angina, prior heart attack; diabetes, high blood pressure, high cholesterol, smoker, or strong family history of heart disease)     HTN 8. PULMONARY RISK FACTORS: "Do you have any history of lung disease?"  (e.g., blood clots in lung, asthma, emphysema, birth control pills)     No 9. CAUSE: "What do you think is causing the chest pain?"     Unsure 10. OTHER SYMPTOMS: "Do you have any other symptoms?" (e.g., dizziness, nausea, vomiting, sweating, fever, difficulty breathing, cough)        No 11. PREGNANCY: "Is there any chance you are pregnant?" "When was your last menstrual period?"       No  Protocols used: CHEST PAIN-A-AH

## 2019-10-26 ENCOUNTER — Ambulatory Visit: Payer: Self-pay

## 2019-10-26 NOTE — Chronic Care Management (AMB) (Signed)
  Chronic Care Management   Note    10/26/2019 Name: DEOSHA WERDEN MRN: [id number] DOB: 13-Feb-1941    Returned call to Ms. Bangura. She provided update regarding a recent clinic visit. Reports being advised to report to the [facility] on 10/23/19 for acute shoulder pain.  Reports pain has resolved and doing very well today. Denies worsening or concerning symptoms since evaluation on Friday.   Follow up plan: The care management team will follow up as scheduled later this month.   [facility] Care Management [phone]

## 2019-11-02 ENCOUNTER — Ambulatory Visit: Payer: Self-pay

## 2019-11-02 DIAGNOSIS — I10 Essential (primary) hypertension: Secondary | ICD-10-CM

## 2019-11-02 NOTE — Chronic Care Management (AMB) (Signed)
Chronic Care Management   Follow Up Note   11/02/2019 Name: Yvonne Andrews MRN: [id number] DOB: July 06, 1941  Primary Care Provider: Alba Cory, MD Reason for referral : Chronic Care Management   Yvonne Andrews is a 79 y.o. year old female who is a primary care patient of Alba Cory, MD. She is currently engaged with the chronic care management team. A routine telephonic outreach was conducted today.  Review of Ms. Berko status, including review of consultants reports, relevant labs and test results was conducted today. Collaboration with appropriate care team members was performed as part of the comprehensive evaluation and provision of chronic care management services.       Outpatient Encounter Medications as of 11/02/2019  Medication Sig  . alendronate (FOSAMAX) 70 MG tablet TAKE 1 TABLET EVERY 7 DAYS ON AN EMPTY STOMACH  WITH  FULL  GLASS  OF  WATER  . aspirin EC 81 MG tablet Take 1 tablet (81 mg total) by mouth daily.  . Cholecalciferol (VITAMIN D) 2000 units CAPS Take 1 capsule (2,000 Units total) by mouth daily.  . MULTIPLE VITAMINS-MINERALS ER PO Take 1 tablet by mouth daily.  . Nutritional Supplements (ESTROVEN PM PO) Take by mouth daily.  . rosuvastatin (CRESTOR) 20 MG tablet Take 1 tablet (20 mg total) by mouth daily.  . timolol (TIMOPTIC) 0.5 % ophthalmic solution Place 1 drop into both eyes 2 (two) times daily.   . traZODone (DESYREL) 50 MG tablet Take 0.5-1 tablets (25-50 mg total) by mouth at bedtime as needed for sleep.  . valsartan (DIOVAN) 80 MG tablet Take 1 tablet (80 mg total) by mouth daily.   No facility-administered encounter medications on file as of 11/02/2019.     Objective:  BP Readings from Last 3 Encounters:  10/20/19 137/74  09/03/19 120/74  07/27/19 133/75    Goals Addressed            This Visit's Progress   . Improve ability to self-manage blood pressure and dyslipidemia.       Current Barriers:  . Chronic Disease Management  support and education needs related to dyslipidemia and hypertension management  Case Manager Clinical Goal(s):  Marland Kitchen Over the next 90 days, patient will continue taking medications as prescribed. . Over the next 90 days, patient will attend all scheduled medical appointments. . Over the next 90 days, patient will continue monitoring blood pressure and record readings. . Over the next 90 days, patient will increase compliance with heart health/cardiac prudent diet.  Interventions:  . Reviewed medications and discussed indications for use. Patient encouraged to take all medications as prescribed and notify MD if unable tolerate regimen.  . Discussed current blood pressure readings. Reviewed education regarding blood pressure parameters and indications for notifying MD. Patient encouraged to maintain a BP log to identify trends.  Reports BP within parameters. BP today was 132/75. Pulese 68bpm. . Reviewed education regarding nutrition and dietary intake. Patient encouraged to increase compliance with heart healthy/cardiac prudent diet. . Patient encouraged to continue mild exercises/activities and walks as tolerated. Continues to engage in mild activities. Walks regularly. . Reviewed pending provider appointments. Patient encouraged to attend appointments as scheduled to prevent delays in care. Continues to attend appointments as scheduled. Contacts clinic for acute concerns as needed. . Discussed plans for ongoing care management follow up and provided patient with direct contact information for care management team.   Patient Self Care Activities:  . Self administers medications as prescribed . Attends scheduled provider appointments .  Calls pharmacy for medication refills . Performs ADL's independently . Performs IADL's independently . Calls provider office for new concerns or questions  Please see past updates related to this goal by clicking on the "Past Updates" button in the selected goal           PLAN -The care management team will follow-up with Ms. Justiss again within the next two weeks.   [facility] Care Management [phone]

## 2019-11-02 NOTE — Patient Instructions (Signed)
Thank you for allowing the Chronic Care Management team to participate in your care.  Goals Addressed            This Visit's Progress   . Improve ability to self-manage blood pressure and dyslipidemia.       Current Barriers:  . Chronic Disease Management support and education needs related to dyslipidemia and hypertension management  Case Manager Clinical Goal(s):  Marland Kitchen Over the next 90 days, patient will continue taking medications as prescribed. . Over the next 90 days, patient will attend all scheduled medical appointments. . Over the next 90 days, patient will continue monitoring blood pressure and record readings. . Over the next 90 days, patient will increase compliance with heart health/cardiac prudent diet.  Interventions:  . Reviewed medications and discussed indications for use. Patient encouraged to take all medications as prescribed and notify MD if unable tolerate regimen.  . Discussed current blood pressure readings. Reviewed education regarding blood pressure parameters and indications for notifying MD. Patient encouraged to maintain a BP log to identify trends.  Reports BP within parameters. BP today was 132/75. Pulese 68bpm. . Reviewed education regarding nutrition and dietary intake. Patient encouraged to increase compliance with heart healthy/cardiac prudent diet. . Patient encouraged to continue mild exercises/activities and walks as tolerated. Continues to engage in mild activities. Walks regularly. . Reviewed pending provider appointments. Patient encouraged to attend appointments as scheduled to prevent delays in care. Continues to attend appointments as scheduled. Contacts clinic for acute concerns as needed. . Discussed plans for ongoing care management follow up and provided patient with direct contact information for care management team.   Patient Self Care Activities:  . Self administers medications as prescribed . Attends scheduled provider  appointments . Calls pharmacy for medication refills . Performs ADL's independently . Performs IADL's independently . Calls provider office for new concerns or questions  Please see past updates related to this goal by clicking on the "Past Updates" button in the selected goal          Ms. Cotham verbalized understanding of the instructions provided during the telephonic outreach today. Did not indicate need for a mailed/printed copy of the instructions.   The care management team will outreach with Ms. Larrick again within the next two weeks.   [facility] Care Management [phone]

## 2019-11-09 ENCOUNTER — Telehealth: Payer: Self-pay

## 2019-11-11 NOTE — Chronic Care Management (AMB) (Signed)
  Chronic Care Management   Note   Name: ADARA KITTLE MRN: [id number] DOB: 29-Jul-1940   Follow-up outreach with Ms. Scaife regarding symptoms related to Covid-19 vaccination. Reports symptoms resolved during the night and she's feeling very well today. Reports morning temperature was within range.   Follow up plan: Ms. Carstens was encouraged to call with any additional concerns as needed. The care management team will follow up as scheduled later this month.   [facility] Care Management [phone]

## 2019-12-23 ENCOUNTER — Other Ambulatory Visit: Payer: Self-pay | Admitting: Family Medicine

## 2019-12-23 DIAGNOSIS — E785 Hyperlipidemia, unspecified: Secondary | ICD-10-CM

## 2019-12-30 DIAGNOSIS — H40053 Ocular hypertension, bilateral: Secondary | ICD-10-CM | POA: Diagnosis not present

## 2020-01-01 ENCOUNTER — Ambulatory Visit: Payer: Self-pay

## 2020-01-01 DIAGNOSIS — E785 Hyperlipidemia, unspecified: Secondary | ICD-10-CM

## 2020-01-01 DIAGNOSIS — I10 Essential (primary) hypertension: Secondary | ICD-10-CM

## 2020-01-01 NOTE — Chronic Care Management (AMB) (Signed)
  Chronic Care Management   Follow Up Note   01/01/2020 Name: Yvonne Andrews MRN: [id number] DOB: 1940-11-26  Primary Care Provider: Steele Sizer, MD Reason for referral : Chronic Care Management   Yvonne Andrews is a 79 y.o. year old female who is a primary care patient of Steele Sizer, MD. She is currently engaged with the chronic care management team. A routine outreach was conducted today.  Review of Yvonne Andrews status, including review of consultants reports, relevant labs and test results was conducted today. Collaboration with appropriate care team members was performed as part of the comprehensive evaluation and provision of chronic care management services.    SDOH (Social Determinants of Health) assessments performed: Yes No further interventions required.    Outpatient Encounter Medications as of 01/01/2020  Medication Sig  . alendronate (FOSAMAX) 70 MG tablet TAKE 1 TABLET EVERY 7 DAYS ON AN EMPTY STOMACH  WITH  FULL  GLASS  OF  WATER  . aspirin EC 81 MG tablet Take 1 tablet (81 mg total) by mouth daily.  . Cholecalciferol (VITAMIN D) 2000 units CAPS Take 1 capsule (2,000 Units total) by mouth daily.  . MULTIPLE VITAMINS-MINERALS ER PO Take 1 tablet by mouth daily.  . Nutritional Supplements (ESTROVEN PM PO) Take by mouth daily.  . rosuvastatin (CRESTOR) 20 MG tablet TAKE 1 TABLET (20 MG TOTAL) BY MOUTH DAILY.  Marland Kitchen timolol (TIMOPTIC) 0.5 % ophthalmic solution Place 1 drop into both eyes 2 (two) times daily.   . traZODone (DESYREL) 50 MG tablet Take 0.5-1 tablets (25-50 mg total) by mouth at bedtime as needed for sleep.  . valsartan (DIOVAN) 80 MG tablet Take 1 tablet (80 mg total) by mouth daily.   No facility-administered encounter medications on file as of 01/01/2020.     Objective:   BP Readings from Last 3 Encounters:  10/20/19 137/74  09/03/19 120/74  07/27/19 133/75     Goals Addressed            This Visit's Progress   . COMPLETED: Improve ability to  self-manage blood pressure and dyslipidemia.       Current Barriers:  . Chronic Disease Management support and education needs related to dyslipidemia and hypertension management  Case Manager Clinical Goal(s):  Marland Kitchen Over the next 90 days, patient will continue taking medications as prescribed . Over the next 90 days, patient will attend all scheduled medical appointments. . Over the next 90 days, patient will continue monitoring blood pressure and record readings. . Over the next 90 days, patient will increase compliance with heart health/cardiac prudent diet.  Interventions:  . Discussed plans for ongoing care management. Yvonne Andrews is doing very well with self-management and remains compliant with medications and provider treatment recommendations. Chronic care management goals met. Next appointment with PCP scheduled for 01/26/20.   Patient Self Care Activities:  . Self administers medications as prescribed . Attends scheduled provider appointments . Calls pharmacy for medication refills . Performs ADL's independently . Performs IADL's independently . Calls provider office for new concerns or questions  Please see past updates related to this goal by clicking on the "Past Updates" button in the selected goal          PLAN Yvonne Andrews has met her chronic care management goals. The care management team will gladly follow-up if additional assistance is needed.    [facility] [phone]

## 2020-01-04 NOTE — Patient Instructions (Addendum)
Thank you for allowing the Chronic Care Management team to participate in your care.  Goals Addressed            This Visit's Progress   . COMPLETED: Improve ability to self-manage blood pressure and dyslipidemia.       Current Barriers:  . Chronic Disease Management support and education needs related to dyslipidemia and hypertension management  Case Manager Clinical Goal(s):  . Over the next 90 days, patient will continue taking medications as prescribed . Over the next 90 days, patient will attend all scheduled medical appointments. . Over the next 90 days, patient will continue monitoring blood pressure and record readings. . Over the next 90 days, patient will increase compliance with heart health/cardiac prudent diet.  Interventions:  . Discussed plans for ongoing care management. Mrs. Bailey is doing very well with self-management and remains compliant with medications and provider treatment recommendations. Chronic care management goals met. Next appointment with PCP scheduled for 01/26/20.   Patient Self Care Activities:  . Self administers medications as prescribed . Attends scheduled provider appointments . Calls pharmacy for medication refills . Performs ADL's independently . Performs IADL's independently . Calls provider office for new concerns or questions  Please see past updates related to this goal by clicking on the "Past Updates" button in the selected goal         Mrs. Peed verbalized understanding of the information discussed during the telephonic outreach today. Declined need for mailed/printed instructions.   Mrs. Tino has met her chronic care management goals. The care management team will gladly follow-up if additional assistance is needed.   Felecia McCray,RN [facility] [phone]    

## 2020-01-15 ENCOUNTER — Ambulatory Visit: Payer: Self-pay

## 2020-01-15 NOTE — Chronic Care Management (AMB) (Signed)
  Chronic Care Management     01/15/2020 Name: Yvonne Andrews MRN: [id number] DOB: 1941/07/05   Care Coordination Only: Resources Mailed.     PLAN Yvonne Andrews met her care management goals. The care management team will gladly follow-up if additional assistance is needed.    Horris Latino [facility] [phone]

## 2020-01-18 ENCOUNTER — Other Ambulatory Visit: Payer: Self-pay | Admitting: Family Medicine

## 2020-01-18 NOTE — Telephone Encounter (Signed)
Requested medications are due for refill today?  Yes  Requested medications are on active medication list?  Yes  Last Refill:   09/11/2019  # 12 with one refill   Future visit scheduled?  Yes in one week.    Notes to Clinic:  Medication failed RX refill protocol due to no Vitamin D level in past 360 days.  Last Vitamin D level was performed on 01/21/2017.

## 2020-01-20 ENCOUNTER — Other Ambulatory Visit: Payer: Self-pay | Admitting: Family Medicine

## 2020-01-20 DIAGNOSIS — I10 Essential (primary) hypertension: Secondary | ICD-10-CM

## 2020-01-26 ENCOUNTER — Encounter: Payer: Self-pay | Admitting: Family Medicine

## 2020-01-26 ENCOUNTER — Ambulatory Visit (INDEPENDENT_AMBULATORY_CARE_PROVIDER_SITE_OTHER): Payer: Medicare PPO | Admitting: Family Medicine

## 2020-01-26 ENCOUNTER — Other Ambulatory Visit: Payer: Self-pay

## 2020-01-26 VITALS — BP 130/70 | HR 69 | Temp 96.9°F | Resp 16 | Ht 65.0 in | Wt 144.0 lb

## 2020-01-26 DIAGNOSIS — E559 Vitamin D deficiency, unspecified: Secondary | ICD-10-CM | POA: Diagnosis not present

## 2020-01-26 DIAGNOSIS — Z0000 Encounter for general adult medical examination without abnormal findings: Secondary | ICD-10-CM | POA: Diagnosis not present

## 2020-01-26 DIAGNOSIS — M8589 Other specified disorders of bone density and structure, multiple sites: Secondary | ICD-10-CM | POA: Diagnosis not present

## 2020-01-26 DIAGNOSIS — M1711 Unilateral primary osteoarthritis, right knee: Secondary | ICD-10-CM | POA: Diagnosis not present

## 2020-01-26 DIAGNOSIS — D692 Other nonthrombocytopenic purpura: Secondary | ICD-10-CM | POA: Diagnosis not present

## 2020-01-26 DIAGNOSIS — R7301 Impaired fasting glucose: Secondary | ICD-10-CM

## 2020-01-26 DIAGNOSIS — Z1231 Encounter for screening mammogram for malignant neoplasm of breast: Secondary | ICD-10-CM | POA: Diagnosis not present

## 2020-01-26 DIAGNOSIS — E785 Hyperlipidemia, unspecified: Secondary | ICD-10-CM | POA: Diagnosis not present

## 2020-01-26 DIAGNOSIS — Z1159 Encounter for screening for other viral diseases: Secondary | ICD-10-CM

## 2020-01-26 DIAGNOSIS — F5101 Primary insomnia: Secondary | ICD-10-CM

## 2020-01-26 DIAGNOSIS — I10 Essential (primary) hypertension: Secondary | ICD-10-CM | POA: Diagnosis not present

## 2020-01-26 MED ORDER — VALSARTAN 80 MG PO TABS: 80.0000 mg | ORAL_TABLET | Freq: Every day | ORAL | 1 refills | Status: DC

## 2020-01-26 MED ORDER — ROSUVASTATIN CALCIUM 20 MG PO TABS: 20.0000 mg | ORAL_TABLET | Freq: Every day | ORAL | 1 refills | Status: DC

## 2020-01-26 MED ORDER — TRAZODONE HCL 50 MG PO TABS: 25.0000 mg | ORAL_TABLET | Freq: Every evening | ORAL | 1 refills | Status: DC | PRN

## 2020-01-26 NOTE — Patient Instructions (Signed)
Preventive Care 38 Years and Older, Female Preventive care refers to lifestyle choices and visits with your health care provider that can promote health and wellness. This includes:  A yearly physical exam. This is also called an annual well check.  Regular dental and eye exams.  Immunizations.  Screening for certain conditions.  Healthy lifestyle choices, such as diet and exercise. What can I expect for my preventive care visit? Physical exam Your health care provider will check:  Height and weight. These may be used to calculate body mass index (BMI), which is a measurement that tells if you are at a healthy weight.  Heart rate and blood pressure.  Your skin for abnormal spots. Counseling Your health care provider may ask you questions about:  Alcohol, tobacco, and drug use.  Emotional well-being.  Home and relationship well-being.  Sexual activity.  Eating habits.  History of falls.  Memory and ability to understand (cognition).  Work and work Statistician.  Pregnancy and menstrual history. What immunizations do I need?  Influenza (flu) vaccine  This is recommended every year. Tetanus, diphtheria, and pertussis (Tdap) vaccine  You may need a Td booster every 10 years. Varicella (chickenpox) vaccine  You may need this vaccine if you have not already been vaccinated. Zoster (shingles) vaccine  You may need this after age 77. Pneumococcal conjugate (PCV13) vaccine  One dose is recommended after age 77. Pneumococcal polysaccharide (PPSV23) vaccine  One dose is recommended after age 77. Measles, mumps, and rubella (MMR) vaccine  You may need at least one dose of MMR if you were born in 1957 or later. You may also need a second dose. Meningococcal conjugate (MenACWY) vaccine  You may need this if you have certain conditions. Hepatitis A vaccine  You may need this if you have certain conditions or if you travel or work in places where you may be exposed  to hepatitis A. Hepatitis B vaccine  You may need this if you have certain conditions or if you travel or work in places where you may be exposed to hepatitis B. Haemophilus influenzae type b (Hib) vaccine  You may need this if you have certain conditions. You may receive vaccines as individual doses or as more than one vaccine together in one shot (combination vaccines). Talk with your health care provider about the risks and benefits of combination vaccines. What tests do I need? Blood tests  Lipid and cholesterol levels. These may be checked every 5 years, or more frequently depending on your overall health.  Hepatitis C test.  Hepatitis B test. Screening  Lung cancer screening. You may have this screening every year starting at age 77 if you have a 30-pack-year history of smoking and currently smoke or have quit within the past 15 years.  Colorectal cancer screening. All adults should have this screening starting at age 77 and continuing until age 15. Your health care provider may recommend screening at age 77 if you are at increased risk. You will have tests every 1-10 years, depending on your results and the type of screening test.  Diabetes screening. This is done by checking your blood sugar (glucose) after you have not eaten for a while (fasting). You may have this done every 1-3 years.  Mammogram. This may be done every 1-2 years. Talk with your health care provider about how often you should have regular mammograms.  BRCA-related cancer screening. This may be done if you have a family history of breast, ovarian, tubal, or peritoneal cancers.  Other tests  Sexually transmitted disease (STD) testing.  Bone density scan. This is done to screen for osteoporosis. You may have this done starting at age 77. Follow these instructions at home: Eating and drinking  Eat a diet that includes fresh fruits and vegetables, whole grains, lean protein, and low-fat dairy products. Limit  your intake of foods with high amounts of sugar, saturated fats, and salt.  Take vitamin and mineral supplements as recommended by your health care provider.  Do not drink alcohol if your health care provider tells you not to drink.  If you drink alcohol: ? Limit how much you have to 0-1 drink a day. ? Be aware of how much alcohol is in your drink. In the [place] one drink equals one 12 oz bottle of beer (355 mL), one 5 oz glass of wine (148 mL), or one 1 oz glass of hard liquor (44 mL). Lifestyle  Take daily care of your teeth and gums.  Stay active. Exercise for at least 30 minutes on 5 or more days each week.  Do not use any products that contain nicotine or tobacco, such as cigarettes, e-cigarettes, and chewing tobacco. If you need help quitting, ask your health care provider.  If you are sexually active, practice safe sex. Use a condom or other form of protection in order to prevent STIs (sexually transmitted infections).  Talk with your health care provider about taking a low-dose aspirin or statin. What's next?  Go to your health care provider once a year for a well check visit.  Ask your health care provider how often you should have your eyes and teeth checked.  Stay up to date on all vaccines. This information is not intended to replace advice given to you by your health care provider. Make sure you discuss any questions you have with your health care provider. Document Revised: 07/03/2018 Document Reviewed: 07/03/2018 Elsevier Patient Education  2020 [employer].

## 2020-01-26 NOTE — Progress Notes (Signed)
Name: Yvonne Andrews   MRN: [id number]    DOB: 1940/11/13   Date:01/26/2020       Progress Note  Subjective  Chief Complaint  Chief Complaint  Patient presents with  . Hypertension  . Hyperglycemia  . Dyslipidemia  . Knee Problem    She has been having problesm with both of her knees. The right knee makes a sound when she moves it and the left knee hurts in the back x 2 months comes and goes.  . Cough    She sometimes get some irritation in her throat that comes and goes and causes her to cough some. She said she may have allergies or a cold.    HPI  Patient presents for annual CPE and follow up  HTN: Patient has been compliant with medication and denies side effects, no chest pain, no palpitations, headaches,dizziness, or BLE edema.She was referred toDr. End - cardiologist in May 2018 for bradycardia, but had repeat EKG and was given reassurance.BP at home can go from 118-145 SBP, usually it is around 120's-130's  Hyperlipidemia:Lipitor caused LFT"s to go up, Pravastatin was not strong enough, taking Rosuvastatin and deneis side effects. We will recheck labs today   Prediabetes/fasting hyperglycemia: hgbA1C was 5.8% June 2019 and also in 2020 .She tried to avoid sweets but unable to quit sweet tea - but sometimes she is adding extra water to dilute it She denies polyphagia, polydipsia or polyuria. We will recheck labs   OA right knee: she states has pain above right knee when going up or down steps, she is concerned about griding sound, but otherwise no swelling or redness. Gave reassurance, explained grinding sound is normal on patients with OA and not an indication for therapy   Vitamin D deficiency: taking otc supplementation, continue supplementationRecheck labs   Osteopenia: last bone density done07/2019 and showed mild progression of osteopenia, with FRAX of major fracture at 5% and hip at 1.1%, she is now on Alendronate since July2019, we will recheck bone  density next month.   Senile purpura: she states she bruises easily on arms or legs, but stable.    Diet: she eats a balanced diet  Exercise: she walks up and down her driveway for about 56-31 minutes, she is also active inside the house cleaning and moving things around, pulling weeds from flower bed. She is active daily .   USPSTF grade A and B recommendations    Office Visit from 09/03/2019 in [facility]  AUDIT-C Score 0     Depression: Phq 9 is  negative Depression screen [facility] 2/9 01/26/2020 01/01/2020 11/02/2019 10/20/2019 09/25/2019  Decreased Interest 0 0 0 0 0  Down, Depressed, Hopeless 0 1 0 0 0  PHQ - 2 Score 0 1 0 0 0  Altered sleeping 0 - - - -  Tired, decreased energy 0 - - - -  Change in appetite 0 - - - -  Feeling bad or failure about yourself  0 - - - -  Trouble concentrating 0 - - - -  Moving slowly or fidgety/restless 0 - - - -  Suicidal thoughts 0 - - - -  PHQ-9 Score 0 - - - -  Difficult doing work/chores - - - - -  Some recent data might be hidden   Hypertension: BP Readings from Last 3 Encounters:  01/26/20 130/70  10/20/19 137/74  09/03/19 120/74   Obesity: Wt Readings from Last 3 Encounters:  01/26/20 144 lb (65.3 kg)  10/20/19 143 lb (64.9 kg)  09/03/19 146 lb 9.6 oz (66.5 kg)   BMI Readings from Last 3 Encounters:  01/26/20 23.96 kg/m  10/20/19 23.80 kg/m  09/03/19 24.40 kg/m     Hep C Screening: today  STD testing and prevention (HIV/chl/gon/syphilis): N/A Intimate partner violence: negative screen  Sexual History (Partners/Practices/Protection from [employer] hx STI/Pregnancy Plans): not sexually active even before her husband died in 09-27-18. She has a neighbor friend that is spending time with her  Pain during Intercourse: Not sexually active  Menstrual History/LMP/Abnormal Bleeding: discussed post menopausal bleeding.  Incontinence Symptoms: no problems at this time, only when bladder is full she has to rush   Breast  cancer:  - Last Mammogram: 02/2019  - BRCA gene screening: N/A  Osteoporosis: Discussed high calcium and vitamin D supplementation, weight bearing exercises, due for repeat bone density test  Cervical cancer screening: N/A  Skin cancer: Discussed monitoring for atypical lesions  Colorectal cancer: normal in 09-27-08 , cologuard negative 27-Sep-2018  Lung cancer:  Low Dose CT Chest recommended if Age 105-80 years, 30 pack-year currently smoking OR have quit w/in 15years. Patient does not qualify.   ECG: 11/2016   Advanced Care Planning: A voluntary discussion about advance care planning including the explanation and discussion of advance directives.  Discussed health care proxy and Living will, and the patient was able to identify a health care proxy as daughters .  Patient does not know have a living will at present time.  Lipids: Lab Results  Component Value Date   CHOL 216 (H) 01/19/2019   CHOL 183 01/16/2018   CHOL 173 01/21/2017   Lab Results  Component Value Date   HDL 77 01/19/2019   HDL 65 01/16/2018   HDL 66 01/21/2017   Lab Results  Component Value Date   LDLCALC 116 (H) 01/19/2019   LDLCALC 98 01/16/2018   LDLCALC 86 01/21/2017   Lab Results  Component Value Date   TRIG 122 01/19/2019   TRIG 103 01/16/2018   TRIG 107 01/21/2017   Lab Results  Component Value Date   CHOLHDL 2.8 01/19/2019   CHOLHDL 2.8 01/16/2018   CHOLHDL 2.6 01/21/2017   No results found for: LDLDIRECT  Glucose: Glucose, Bld  Date Value Ref Range Status  01/19/2019 95 65 - 99 mg/dL Final    Comment:    .            Fasting reference interval .   09/09/2018 92 65 - 99 mg/dL Final    Comment:    .            Fasting reference interval .   01/16/2018 92 65 - 99 mg/dL Final    Comment:    .            Fasting reference interval .     Patient Active Problem List   Diagnosis Date Noted  . Unspecified glaucoma 06/24/2017  . Body mass index (BMI) of 24.0-24.9 in adult 06/24/2017  .  Diverticulosis 03/08/2017  . Vitamin D deficiency 05/29/2016  . Intraocular pressure increase 07/04/2015  . Bilateral tinnitus 07/04/2015  . Benign essential HTN 12/31/2014  . Carpal tunnel syndrome 12/31/2014  . Dyslipidemia 12/31/2014  . Ovarian failure 12/31/2014  . Osteopenia 12/31/2014  . Allergic rhinitis 12/31/2014  . Arthritis of knee, degenerative 12/31/2014  . Acquired trigger finger 12/31/2014  . Cataracts, bilateral 12/31/2014  . Fasting hyperglycemia 12/31/2014  . LBP (low back pain) 05/27/2007    Past  Surgical History:  Procedure Laterality Date  . TRIGGER FINGER RELEASE    . TUBAL LIGATION      Family History  Problem Relation Age of Onset  . Heart disease Mother   . CAD Father   . Heart attack Father 27  . Cancer Brother   . Heart disease Brother   . Breast cancer Niece        sisters daughter    Social History   Socioeconomic History  . Marital status: Widowed    Spouse name: Charlotte Crumb  . Number of children: 4  . Years of education: Not on file  . Highest education level: 12th grade  Occupational History  . Occupation: Retired  Tobacco Use  . Smoking status: Never Smoker  . Smokeless tobacco: Never Used  . Tobacco comment: smoking cessation materials not required  Vaping Use  . Vaping Use: Never used  Substance and Sexual Activity  . Alcohol use: No    Alcohol/week: 0.0 standard drinks  . Drug use: No  . Sexual activity: Not Currently  Other Topics Concern  . Not on file  Social History Narrative   Husband diagnosed with lung cancer Fall of 2019, he died 2018/10/21   One child lives in town    Social Determinants of Health   Financial Resource Strain: Low Risk   . Difficulty of Paying Living Expenses: Not hard at all  Food Insecurity: No Food Insecurity  . Worried About Charity fundraiser in the Last Year: Never true  . Ran Out of Food in the Last Year: Never true  Transportation Needs: No Transportation Needs  . Lack of  Transportation (Medical): No  . Lack of Transportation (Non-Medical): No  Physical Activity: Sufficiently Active  . Days of Exercise per Week: 5 days  . Minutes of Exercise per Session: 30 min  Stress: No Stress Concern Present  . Feeling of Stress : Not at all  Social Connections: Moderately Integrated  . Frequency of Communication with Friends and Family: More than three times a week  . Frequency of Social Gatherings with Friends and Family: More than three times a week  . Attends Religious Services: More than 4 times per year  . Active Member of Clubs or Organizations: Yes  . Attends Archivist Meetings: More than 4 times per year  . Marital Status: Widowed  Intimate Partner Violence: Not At Risk  . Fear of Current or Ex-Partner: No  . Emotionally Abused: No  . Physically Abused: No  . Sexually Abused: No     Current Outpatient Medications:  .  alendronate (FOSAMAX) 70 MG tablet, TAKE 1 TABLET EVERY 7 DAYS ON AN EMPTY STOMACH  WITH  FULL  GLASS  OF  WATER, Disp: 12 tablet, Rfl: 1 .  aspirin EC 81 MG tablet, Take 1 tablet (81 mg total) by mouth daily., Disp: 30 tablet, Rfl: 0 .  Cholecalciferol (VITAMIN D) 2000 units CAPS, Take 1 capsule (2,000 Units total) by mouth daily., Disp: 30 capsule, Rfl: 0 .  MULTIPLE VITAMINS-MINERALS ER PO, Take 1 tablet by mouth daily., Disp: , Rfl:  .  Nutritional Supplements (ESTROVEN PM PO), Take by mouth daily., Disp: , Rfl:  .  timolol (TIMOPTIC) 0.5 % ophthalmic solution, Place 1 drop into both eyes 2 (two) times daily. , Disp: , Rfl:  .  rosuvastatin (CRESTOR) 20 MG tablet, Take 1 tablet (20 mg total) by mouth daily., Disp: 90 tablet, Rfl: 1 .  traZODone (DESYREL) 50 MG  tablet, Take 0.5-1 tablets (25-50 mg total) by mouth at bedtime as needed for sleep., Disp: 90 tablet, Rfl: 1 .  valsartan (DIOVAN) 80 MG tablet, Take 1 tablet (80 mg total) by mouth daily., Disp: 90 tablet, Rfl: 1  Allergies  Allergen Reactions  . Lipitor  [Atorvastatin] Other (See Comments)    Elevation of LFT's     ROS  Constitutional: Negative for fever or weight change.  Respiratory: Negative for cough and shortness of breath.   Cardiovascular: Negative for chest pain or palpitations.  Gastrointestinal: Negative for abdominal pain, no bowel changes.  Musculoskeletal: Negative for gait problem or joint swelling.  Skin: Negative for rash.  Neurological: Negative for dizziness or headache.  No other specific complaints in a complete review of systems (except as listed in HPI above).  Objective  Vitals:   01/26/20 0927  BP: 130/70  Pulse: 69  Resp: 16  Temp: (!) 96.9 F (36.1 C)  TempSrc: Temporal  SpO2: 99%  Weight: 144 lb (65.3 kg)  Height: 5' 5"  (1.651 m)    Body mass index is 23.96 kg/m.  Physical Exam  Constitutional: Patient appears well-developed and well-nourished. No distress.  HENT: Head: Normocephalic and atraumatic. Ears: B TMs ok, no erythema or effusion; Nose: Nose normal. Mouth/Throat: Oropharynx is clear and moist. No oropharyngeal exudate.  Eyes: Conjunctivae and EOM are normal. Pupils are equal, round, and reactive to light. No scleral icterus.  Neck: Normal range of motion. Neck supple. No JVD present. No thyromegaly present.  Cardiovascular: Normal rate, regular rhythm and normal heart sounds.  No murmur heard. No BLE edema. Pulmonary/Chest: Effort normal and breath sounds normal. No respiratory distress. Abdominal: Soft. Bowel sounds are normal, no distension. There is no tenderness. no masses Breast: no lumps or masses, no nipple discharge or rashes FEMALE GENITALIA:  Not done  RECTAL: not done  Musculoskeletal: Normal range of motion, no joint effusions. No gross deformities Neurological: he is alert and oriented to person, place, and time. No cranial nerve deficit. Coordination, balance, strength, speech and gait are normal.  Skin: Skin is warm and dry. No rash noted. No erythema.  Psychiatric:  Patient has a normal mood and affect. behavior is normal. Judgment and thought content normal.  Fall Risk: Fall Risk  01/26/2020 11/02/2019 10/20/2019 09/25/2019 09/03/2019  Falls in the past year? 0 0 0 0 0  Number falls in past yr: 0 - 0 - 0  Injury with Fall? 0 - 0 - 0  Risk for fall due to : - - No Fall Risks - -  Follow up - - Falls prevention discussed Falls prevention discussed -     Assessment & Plan  1. Benign essential HTN  - CBC with Differential/Platelet - COMPLETE METABOLIC PANEL WITH GFR - valsartan (DIOVAN) 80 MG tablet; Take 1 tablet (80 mg total) by mouth daily.  Dispense: 90 tablet; Refill: 1  2. Dyslipidemia  - Lipid panel - rosuvastatin (CRESTOR) 20 MG tablet; Take 1 tablet (20 mg total) by mouth daily.  Dispense: 90 tablet; Refill: 1  3. Vitamin D deficiency  - VITAMIN D 25 Hydroxy (Vit-D Deficiency, Fractures)  4. Senile purpura (HCC)  Stable   5. Fasting hyperglycemia  - Hemoglobin A1c  6. Primary osteoarthritis of right knee   7. Well adult exam   8. Encounter for screening mammogram for breast cancer  - MM 3D SCREEN BREAST BILATERAL; Future  9. Osteopenia of multiple sites  - DG Bone Density; Future  10. Need  for hepatitis C screening test  - Hepatitis C antibody  11. Primary insomnia  - traZODone (DESYREL) 50 MG tablet; Take 0.5-1 tablets (25-50 mg total) by mouth at bedtime as needed for sleep.  Dispense: 90 tablet; Refill: 1  -USPSTF grade A and B recommendations reviewed with patient; age-appropriate recommendations, preventive care, screening tests, etc discussed and encouraged; healthy living encouraged; see AVS for patient education given to patient -Discussed importance of 150 minutes of physical activity weekly, eat two servings of fish weekly, eat one serving of tree nuts ( cashews, pistachios, pecans, almonds.Marland Kitchen) every other day, eat 6 servings of fruit/vegetables daily and drink plenty of water and avoid sweet beverages.

## 2020-01-27 LAB — CBC WITH DIFFERENTIAL/PLATELET
Absolute Monocytes: 408 cells/uL (ref 200–950)
Basophils Absolute: 72 cells/uL (ref 0–200)
Basophils Relative: 1.2 %
Eosinophils Absolute: 288 cells/uL (ref 15–500)
Eosinophils Relative: 4.8 %
HCT: 41.9 % (ref 35.0–45.0)
Hemoglobin: 13.4 g/dL (ref 11.7–15.5)
Lymphs Abs: 1710 cells/uL (ref 850–3900)
MCH: 29 pg (ref 27.0–33.0)
MCHC: 32 g/dL (ref 32.0–36.0)
MCV: 90.7 fL (ref 80.0–100.0)
MPV: 10 fL (ref 7.5–12.5)
Monocytes Relative: 6.8 %
Neutro Abs: 3522 cells/uL (ref 1500–7800)
Neutrophils Relative %: 58.7 %
Platelets: 245 10*3/uL (ref 140–400)
RBC: 4.62 10*6/uL (ref 3.80–5.10)
RDW: 13.2 % (ref 11.0–15.0)
Total Lymphocyte: 28.5 %
WBC: 6 10*3/uL (ref 3.8–10.8)

## 2020-01-27 LAB — LIPID PANEL
Cholesterol: 175 mg/dL (ref <200)
HDL: 65 mg/dL (ref >=50)
LDL Cholesterol (Calc): 92 mg/dL (calc)
Non-HDL Cholesterol (Calc): 110 mg/dL (calc) (ref <130)
Total CHOL/HDL Ratio: 2.7 (calc) (ref <5.0)
Triglycerides: 89 mg/dL (ref <150)

## 2020-01-27 LAB — HEPATITIS C ANTIBODY
Hepatitis C Ab: NONREACTIVE
SIGNAL TO CUT-OFF: 0.01 (ref <1.00)

## 2020-01-27 LAB — COMPLETE METABOLIC PANEL WITH GFR
AG Ratio: 1.6 (calc) (ref 1.0–2.5)
ALT: 15 U/L (ref 6–29)
AST: 24 U/L (ref 10–35)
Albumin: 4.5 g/dL (ref 3.6–5.1)
Alkaline phosphatase (APISO): 61 U/L (ref 37–153)
BUN/Creatinine Ratio: 13 (calc) (ref 6–22)
BUN: 12 mg/dL (ref 7–25)
CO2: 29 mmol/L (ref 20–32)
Calcium: 9.9 mg/dL (ref 8.6–10.4)
Chloride: 104 mmol/L (ref 98–110)
Creat: 0.95 mg/dL — ABNORMAL HIGH (ref 0.60–0.93)
GFR, Est African American: 67 mL/min/{1.73_m2} (ref >=60)
GFR, Est Non African American: 58 mL/min/{1.73_m2} — ABNORMAL LOW (ref >=60)
Globulin: 2.8 g/dL (calc) (ref 1.9–3.7)
Glucose, Bld: 98 mg/dL (ref 65–99)
Potassium: 4.2 mmol/L (ref 3.5–5.3)
Sodium: 141 mmol/L (ref 135–146)
Total Bilirubin: 0.6 mg/dL (ref 0.2–1.2)
Total Protein: 7.3 g/dL (ref 6.1–8.1)

## 2020-01-27 LAB — HEMOGLOBIN A1C
Hgb A1c MFr Bld: 6 % of total Hgb — ABNORMAL HIGH (ref <5.7)
Mean Plasma Glucose: 126 (calc)
eAG (mmol/L): 7 (calc)

## 2020-01-27 LAB — VITAMIN D 25 HYDROXY (VIT D DEFICIENCY, FRACTURES): Vit D, 25-Hydroxy: 47 ng/mL (ref 30–100)

## 2020-02-22 ENCOUNTER — Other Ambulatory Visit: Payer: Self-pay | Admitting: Family Medicine

## 2020-02-22 DIAGNOSIS — M8589 Other specified disorders of bone density and structure, multiple sites: Secondary | ICD-10-CM

## 2020-02-25 ENCOUNTER — Ambulatory Visit
Admission: RE | Admit: 2020-02-25 | Discharge: 2020-02-25 | Disposition: A | Discharge status: Home / Self Care | Payer: Medicare PPO | Source: Ambulatory Visit | Attending: Family Medicine | Admitting: Family Medicine

## 2020-02-25 ENCOUNTER — Other Ambulatory Visit: Payer: Self-pay

## 2020-02-25 DIAGNOSIS — Z1231 Encounter for screening mammogram for malignant neoplasm of breast: Secondary | ICD-10-CM | POA: Insufficient documentation

## 2020-02-25 DIAGNOSIS — Z780 Asymptomatic menopausal state: Secondary | ICD-10-CM | POA: Diagnosis not present

## 2020-02-25 DIAGNOSIS — M8589 Other specified disorders of bone density and structure, multiple sites: Secondary | ICD-10-CM | POA: Insufficient documentation

## 2020-02-25 DIAGNOSIS — R29890 Loss of height: Secondary | ICD-10-CM | POA: Diagnosis not present

## 2020-03-02 ENCOUNTER — Telehealth: Payer: Self-pay

## 2020-05-18 ENCOUNTER — Ambulatory Visit: Payer: Self-pay

## 2020-05-18 NOTE — Chronic Care Management (AMB) (Signed)
  Chronic Care Management   Note  05/18/2020 Name: Yvonne Andrews MRN: [id number] DOB: 1941/06/03    Care Coordination Only:  Call received from Mrs. Cloer regarding eligibility for the Covid-19 booster. Per Immunization record, her 2nd Moderna vaccine was received on 09/30/19.  Appointment scheduled for her to receive the Moderna booster on 05/19/20. Discussed possible side effects along with concerning s/sx that require medical attention. She agreed to notify the care management team if additional assistance is needed.     PLAN Yvonne Andrews will contact the care management team if additional assistance is needed.    [facility] Care Management [facility] [phone]

## 2020-05-25 ENCOUNTER — Other Ambulatory Visit: Payer: Self-pay | Admitting: Family Medicine

## 2020-05-31 DIAGNOSIS — Z20822 Contact with and (suspected) exposure to covid-19: Secondary | ICD-10-CM | POA: Diagnosis not present

## 2020-06-08 ENCOUNTER — Other Ambulatory Visit: Payer: Self-pay | Admitting: Family Medicine

## 2020-06-08 DIAGNOSIS — I10 Essential (primary) hypertension: Secondary | ICD-10-CM

## 2020-06-29 DIAGNOSIS — H43813 Vitreous degeneration, bilateral: Secondary | ICD-10-CM | POA: Diagnosis not present

## 2020-06-29 DIAGNOSIS — H35342 Macular cyst, hole, or pseudohole, left eye: Secondary | ICD-10-CM | POA: Diagnosis not present

## 2020-07-13 ENCOUNTER — Other Ambulatory Visit: Payer: Self-pay | Admitting: Family Medicine

## 2020-07-13 DIAGNOSIS — I10 Essential (primary) hypertension: Secondary | ICD-10-CM

## 2020-07-27 NOTE — Progress Notes (Signed)
Name: Yvonne Andrews   MRN: [id number]    DOB: [birth year]   Date:07/28/2020       Progress Note  Subjective  Chief Complaint  Follow up   HPI   HTN: Patient has been compliant with medication and denies side effects, no chest pain, no palpitations, headaches,dizziness, or BLE edema.She was referred toDr. End - cardiologist in May 2018 for bradycardia, but had repeat EKG and was given reassurance.BP at home can spike to 160/90, but she states her monitor is old and would like a new one, advised to bring it with her for calibration if needed. To recheck after rest also   Hyperlipidemia:Lipitor caused LFT"s to go up, Pravastatin was not strong enough, taking Rosuvastatin and deneis side effects. Last labs reviewed with patient   Prediabetes/fasting hyperglycemia: hgbA1C has been between 5.8 % to 6.1% since 2016 She has been cutting down on carbs but explained just to replace with complex carbs.  She denies polyphagia, polydipsia or polyuria.   OA right knee: she states has pain above right knee when going up or down steps, she states grinding is stable, takes Ibuprofen occasionally, explained Tylenol is safer and to keep at 2 g per day   Vitamin D deficiency: taking otc supplementation, last level at goal   Osteopenia: last bone density done07/2019 and showed mild progression of osteopenia, with FRAX of major fracture at 5% and hip at 1.1%, she is now on Alendronate since July2019, last bone density showed improvement   Senile purpura: she states she bruises easily on arms or legs. Unchanged    Patient Active Problem List   Diagnosis Date Noted  . Unspecified glaucoma 06/24/2017  . Body mass index (BMI) of 24.0-24.9 in adult 06/24/2017  . Diverticulosis 03/08/2017  . Vitamin D deficiency 05/29/2016  . Intraocular pressure increase 07/04/2015  . Bilateral tinnitus 07/04/2015  . Benign essential HTN 12/31/2014  . Carpal tunnel syndrome 12/31/2014  . Dyslipidemia  12/31/2014  . Ovarian failure 12/31/2014  . Osteopenia 12/31/2014  . Allergic rhinitis 12/31/2014  . Arthritis of knee, degenerative 12/31/2014  . Acquired trigger finger 12/31/2014  . Cataracts, bilateral 12/31/2014  . Fasting hyperglycemia 12/31/2014  . LBP (low back pain) 05/27/2007    Past Surgical History:  Procedure Laterality Date  . TRIGGER FINGER RELEASE    . TUBAL LIGATION      Family History  Problem Relation Age of Onset  . Heart disease Mother   . CAD Father   . Heart attack Father 5  . Cancer Brother   . Heart disease Brother   . Breast cancer Niece        sisters daughter    Social History   Tobacco Use  . Smoking status: Never Smoker  . Smokeless tobacco: Never Used  . Tobacco comment: smoking cessation materials not required  Substance Use Topics  . Alcohol use: No    Alcohol/week: 0.0 standard drinks     Current Outpatient Medications:  .  alendronate (FOSAMAX) 70 MG tablet, TAKE 1 TABLET EVERY 7 DAYS ON AN EMPTY STOMACH  WITH  FULL  GLASS  OF  WATER, Disp: 12 tablet, Rfl: 0 .  aspirin EC 81 MG tablet, Take 1 tablet (81 mg total) by mouth daily., Disp: 30 tablet, Rfl: 0 .  Cholecalciferol (VITAMIN D) 2000 units CAPS, Take 1 capsule (2,000 Units total) by mouth daily., Disp: 30 capsule, Rfl: 0 .  MULTIPLE VITAMINS-MINERALS ER PO, Take 1 tablet by mouth daily., Disp: , Rfl:  .  Nutritional Supplements (ESTROVEN PM PO), Take by mouth daily., Disp: , Rfl:  .  rosuvastatin (CRESTOR) 20 MG tablet, Take 1 tablet (20 mg total) by mouth daily., Disp: 90 tablet, Rfl: 1 .  timolol (TIMOPTIC) 0.5 % ophthalmic solution, Place 1 drop into both eyes 2 (two) times daily. , Disp: , Rfl:  .  traZODone (DESYREL) 50 MG tablet, Take 0.5-1 tablets (25-50 mg total) by mouth at bedtime as needed for sleep., Disp: 90 tablet, Rfl: 1 .  valsartan (DIOVAN) 80 MG tablet, TAKE 1 TABLET EVERY DAY, Disp: 90 tablet, Rfl: 0  Allergies  Allergen Reactions  . Lipitor  [Atorvastatin] Other (See Comments)    Elevation of LFT's    I personally reviewed active problem list, medication list, allergies, family history, social history, health maintenance with the patient/caregiver today.   ROS  Constitutional: Negative for fever or weight change.  Respiratory: Negative for cough and shortness of breath.   Cardiovascular: Negative for chest pain or palpitations.  Gastrointestinal: Negative for abdominal pain, no bowel changes.  Musculoskeletal: Negative for gait problem or joint swelling.  Skin: Negative for rash.  Neurological: Negative for dizziness or headache.  No other specific complaints in a complete review of systems (except as listed in HPI above).  Objective  Vitals:   07/28/20 0754  BP: 120/70  Pulse: 65  Resp: 16  Temp: 98.1 F (36.7 C)  TempSrc: Oral  SpO2: 98%  Weight: 139 lb (63 kg)  Height: 5\' 5"  (1.651 m)    Body mass index is 23.13 kg/m.  Physical Exam  Constitutional: Patient appears well-developed and well-nourished. No distress.  HEENT: head atraumatic, normocephalic, pupils equal and reactive to light,  neck supple Cardiovascular: Normal rate, regular rhythm and normal heart sounds.  No murmur heard. No BLE edema. Pulmonary/Chest: Effort normal and breath sounds normal. No respiratory distress. Abdominal: Soft.  There is no tenderness. Muscular Skeletal: right knee crepitus  Psychiatric: Patient has a normal mood and affect. behavior is normal. Judgment and thought content normal.  PHQ2/9: Depression screen [facility] 2/9 07/28/2020 01/26/2020 01/01/2020 11/02/2019 10/20/2019  Decreased Interest 0 0 0 0 0  Down, Depressed, Hopeless 0 0 1 0 0  PHQ - 2 Score 0 0 1 0 0  Altered sleeping - 0 - - -  Tired, decreased energy - 0 - - -  Change in appetite - 0 - - -  Feeling bad or failure about yourself  - 0 - - -  Trouble concentrating - 0 - - -  Moving slowly or fidgety/restless - 0 - - -  Suicidal thoughts - 0 - - -  PHQ-9  Score - 0 - - -  Difficult doing work/chores - - - - -  Some recent data might be hidden    phq 9 is negative   Fall Risk: Fall Risk  07/28/2020 01/26/2020 11/02/2019 10/20/2019 09/25/2019  Falls in the past year? 0 0 0 0 0  Number falls in past yr: 0 0 - 0 -  Injury with Fall? 0 0 - 0 -  Risk for fall due to : - - - No Fall Risks -  Follow up Falls evaluation completed - - Falls prevention discussed Falls prevention discussed     Functional Status Survey: Is the patient deaf or have difficulty hearing?: No Does the patient have difficulty seeing, even when wearing glasses/contacts?: No Does the patient have difficulty concentrating, remembering, or making decisions?: No Does the patient have difficulty walking or climbing  stairs?: No Does the patient have difficulty dressing or bathing?: No Does the patient have difficulty doing errands alone such as visiting a doctor's office or shopping?: No    Assessment & Plan  1. Benign essential HTN  - valsartan (DIOVAN) 80 MG tablet; Take 1 tablet (80 mg total) by mouth daily.  Dispense: 90 tablet; Refill: 1  2. Senile purpura (HCC)  Stable   3. Dyslipidemia  - rosuvastatin (CRESTOR) 20 MG tablet; Take 1 tablet (20 mg total) by mouth daily.  Dispense: 90 tablet; Refill: 1  4. Fasting hyperglycemia  Advised to avoid cutting down on calories, her A1C has been stable, I explained I prefer if she does no lose any more weight   5. Primary osteoarthritis of right knee   6. Osteopenia, unspecified location  - alendronate (FOSAMAX) 70 MG tablet; Take 1 tablet (70 mg total) by mouth once a week. Take with a full glass of water on an empty stomach.  Dispense: 12 tablet; Refill: 1  7. Primary insomnia  - traZODone (DESYREL) 50 MG tablet; Take 0.5-1 tablets (25-50 mg total) by mouth at bedtime as needed for sleep.  Dispense: 90 tablet; Refill: 1  8. Vitamin D deficiency  Continue supplementation

## 2020-07-28 ENCOUNTER — Encounter: Payer: Self-pay | Admitting: Family Medicine

## 2020-07-28 ENCOUNTER — Ambulatory Visit: Payer: Medicare PPO | Admitting: Family Medicine

## 2020-07-28 ENCOUNTER — Other Ambulatory Visit: Payer: Self-pay

## 2020-07-28 VITALS — BP 120/70 | HR 65 | Temp 98.1°F | Resp 16 | Ht 65.0 in | Wt 139.0 lb

## 2020-07-28 DIAGNOSIS — E559 Vitamin D deficiency, unspecified: Secondary | ICD-10-CM | POA: Diagnosis not present

## 2020-07-28 DIAGNOSIS — F5101 Primary insomnia: Secondary | ICD-10-CM | POA: Diagnosis not present

## 2020-07-28 DIAGNOSIS — I10 Essential (primary) hypertension: Secondary | ICD-10-CM

## 2020-07-28 DIAGNOSIS — M1711 Unilateral primary osteoarthritis, right knee: Secondary | ICD-10-CM | POA: Diagnosis not present

## 2020-07-28 DIAGNOSIS — E785 Hyperlipidemia, unspecified: Secondary | ICD-10-CM | POA: Diagnosis not present

## 2020-07-28 DIAGNOSIS — R7301 Impaired fasting glucose: Secondary | ICD-10-CM | POA: Diagnosis not present

## 2020-07-28 DIAGNOSIS — D692 Other nonthrombocytopenic purpura: Secondary | ICD-10-CM | POA: Diagnosis not present

## 2020-07-28 DIAGNOSIS — M8580 Other specified disorders of bone density and structure, unspecified site: Secondary | ICD-10-CM

## 2020-07-28 MED ORDER — VALSARTAN 80 MG PO TABS: 80.0000 mg | ORAL_TABLET | Freq: Every day | ORAL | 1 refills | Status: DC

## 2020-07-28 MED ORDER — ROSUVASTATIN CALCIUM 20 MG PO TABS: 20.0000 mg | ORAL_TABLET | Freq: Every day | ORAL | 1 refills | Status: DC

## 2020-07-28 MED ORDER — BLOOD PRESSURE KIT KIT: 1.0000 | PACK | Freq: Every day | 0 refills | Status: DC

## 2020-07-28 MED ORDER — ALENDRONATE SODIUM 70 MG PO TABS: 70.0000 mg | ORAL_TABLET | ORAL | 1 refills | Status: DC

## 2020-07-28 MED ORDER — TRAZODONE HCL 50 MG PO TABS: 25.0000 mg | ORAL_TABLET | Freq: Every evening | ORAL | 1 refills | Status: DC | PRN

## 2020-10-20 ENCOUNTER — Ambulatory Visit (INDEPENDENT_AMBULATORY_CARE_PROVIDER_SITE_OTHER): Payer: Medicare PPO

## 2020-10-20 VITALS — BP 123/73 | HR 62 | Temp 96.3°F | Ht 65.0 in | Wt 140.0 lb

## 2020-10-20 DIAGNOSIS — Z0000 Encounter for general adult medical examination without abnormal findings: Secondary | ICD-10-CM

## 2020-10-20 NOTE — Patient Instructions (Signed)
Yvonne Andrews , Thank you for taking time to come for your Medicare Wellness Visit. I appreciate your ongoing commitment to your health goals. Please review the following plan we discussed and let me know if I can assist you in the future.   Screening recommendations/referrals: Colonoscopy: no longer required Mammogram: done 02/25/20 Bone Density: done 02/25/20 Recommended yearly ophthalmology/optometry visit for glaucoma screening and checkup Recommended yearly dental visit for hygiene and checkup  Vaccinations: Influenza vaccine: done 04/26/20 Pneumococcal vaccine: done 09/20/16 Tdap vaccine: due Shingles vaccine: Shingrix discussed. Please contact your pharmacy for coverage information.  Covid-19: done 08/31/19, 09/30/19 & 05/19/20  Advanced directives: Advance directive discussed with you today. I have provided a copy for you to complete at home and have notarized. Once this is complete please bring a copy in to our office so we can scan it into your chart.  Conditions/risks identified: Recommend drinking 6-8 glasses of water per day.   Next appointment: Follow up in one year for your annual wellness visit    Preventive Care 65 Years and Older, Female Preventive care refers to lifestyle choices and visits with your health care provider that can promote health and wellness. What does preventive care include?  A yearly physical exam. This is also called an annual well check.  Dental exams once or twice a year.  Routine eye exams. Ask your health care provider how often you should have your eyes checked.  Personal lifestyle choices, including:  Daily care of your teeth and gums.  Regular physical activity.  Eating a healthy diet.  Avoiding tobacco and drug use.  Limiting alcohol use.  Practicing safe sex.  Taking low-dose aspirin every day.  Taking vitamin and mineral supplements as recommended by your health care provider. What happens during an annual well check? The services  and screenings done by your health care provider during your annual well check will depend on your age, overall health, lifestyle risk factors, and family history of disease. Counseling  Your health care provider may ask you questions about your:  Alcohol use.  Tobacco use.  Drug use.  Emotional well-being.  Home and relationship well-being.  Sexual activity.  Eating habits.  History of falls.  Memory and ability to understand (cognition).  Work and work Astronomer.  Reproductive health. Screening  You may have the following tests or measurements:  Height, weight, and BMI.  Blood pressure.  Lipid and cholesterol levels. These may be checked every 5 years, or more frequently if you are over 50 years old.  Skin check.  Lung cancer screening. You may have this screening every year starting at age 6 if you have a 30-pack-year history of smoking and currently smoke or have quit within the past 15 years.  Fecal occult blood test (FOBT) of the stool. You may have this test every year starting at age 64.  Flexible sigmoidoscopy or colonoscopy. You may have a sigmoidoscopy every 5 years or a colonoscopy every 10 years starting at age 62.  Hepatitis C blood test.  Hepatitis B blood test.  Sexually transmitted disease (STD) testing.  Diabetes screening. This is done by checking your blood sugar (glucose) after you have not eaten for a while (fasting). You may have this done every 1-3 years.  Bone density scan. This is done to screen for osteoporosis. You may have this done starting at age 36.  Mammogram. This may be done every 1-2 years. Talk to your health care provider about how often you should have regular  mammograms. Talk with your health care provider about your test results, treatment options, and if necessary, the need for more tests. Vaccines  Your health care provider may recommend certain vaccines, such as:  Influenza vaccine. This is recommended every  year.  Tetanus, diphtheria, and acellular pertussis (Tdap, Td) vaccine. You may need a Td booster every 10 years.  Zoster vaccine. You may need this after age 85.  Pneumococcal 13-valent conjugate (PCV13) vaccine. One dose is recommended after age 48.  Pneumococcal polysaccharide (PPSV23) vaccine. One dose is recommended after age 40. Talk to your health care provider about which screenings and vaccines you need and how often you need them. This information is not intended to replace advice given to you by your health care provider. Make sure you discuss any questions you have with your health care provider. Document Released: 08/05/2015 Document Revised: 03/28/2016 Document Reviewed: 05/10/2015 Elsevier Interactive Patient Education  2017 [place] Prevention in the Home Falls can cause injuries. They can happen to people of all ages. There are many things you can do to make your home safe and to help prevent falls. What can I do on the outside of my home?  Regularly fix the edges of walkways and driveways and fix any cracks.  Remove anything that might make you trip as you walk through a door, such as a raised step or threshold.  Trim any bushes or trees on the path to your home.  Use bright outdoor lighting.  Clear any walking paths of anything that might make someone trip, such as rocks or tools.  Regularly check to see if handrails are loose or broken. Make sure that both sides of any steps have handrails.  Any raised decks and porches should have guardrails on the edges.  Have any leaves, snow, or ice cleared regularly.  Use sand or salt on walking paths during winter.  Clean up any spills in your garage right away. This includes oil or grease spills. What can I do in the bathroom?  Use night lights.  Install grab bars by the toilet and in the tub and shower. Do not use towel bars as grab bars.  Use non-skid mats or decals in the tub or shower.  If you  need to sit down in the shower, use a plastic, non-slip stool.  Keep the floor dry. Clean up any water that spills on the floor as soon as it happens.  Remove soap buildup in the tub or shower regularly.  Attach bath mats securely with double-sided non-slip rug tape.  Do not have throw rugs and other things on the floor that can make you trip. What can I do in the bedroom?  Use night lights.  Make sure that you have a light by your bed that is easy to reach.  Do not use any sheets or blankets that are too big for your bed. They should not hang down onto the floor.  Have a firm chair that has side arms. You can use this for support while you get dressed.  Do not have throw rugs and other things on the floor that can make you trip. What can I do in the kitchen?  Clean up any spills right away.  Avoid walking on wet floors.  Keep items that you use a lot in easy-to-reach places.  If you need to reach something above you, use a strong step stool that has a grab bar.  Keep electrical cords out of the way.  Do not use floor polish or wax that makes floors slippery. If you must use wax, use non-skid floor wax.  Do not have throw rugs and other things on the floor that can make you trip. What can I do with my stairs?  Do not leave any items on the stairs.  Make sure that there are handrails on both sides of the stairs and use them. Fix handrails that are broken or loose. Make sure that handrails are as long as the stairways.  Check any carpeting to make sure that it is firmly attached to the stairs. Fix any carpet that is loose or worn.  Avoid having throw rugs at the top or bottom of the stairs. If you do have throw rugs, attach them to the floor with carpet tape.  Make sure that you have a light switch at the top of the stairs and the bottom of the stairs. If you do not have them, ask someone to add them for you. What else can I do to help prevent falls?  Wear shoes  that:  Do not have high heels.  Have rubber bottoms.  Are comfortable and fit you well.  Are closed at the toe. Do not wear sandals.  If you use a stepladder:  Make sure that it is fully opened. Do not climb a closed stepladder.  Make sure that both sides of the stepladder are locked into place.  Ask someone to hold it for you, if possible.  Clearly mark and make sure that you can see:  Any grab bars or handrails.  First and last steps.  Where the edge of each step is.  Use tools that help you move around (mobility aids) if they are needed. These include:  Canes.  Walkers.  Scooters.  Crutches.  Turn on the lights when you go into a dark area. Replace any light bulbs as soon as they burn out.  Set up your furniture so you have a clear path. Avoid moving your furniture around.  If any of your floors are uneven, fix them.  If there are any pets around you, be aware of where they are.  Review your medicines with your doctor. Some medicines can make you feel dizzy. This can increase your chance of falling. Ask your doctor what other things that you can do to help prevent falls. This information is not intended to replace advice given to you by your health care provider. Make sure you discuss any questions you have with your health care provider. Document Released: 05/05/2009 Document Revised: 12/15/2015 Document Reviewed: 08/13/2014 Elsevier Interactive Patient Education  2017 [employer].

## 2020-10-20 NOTE — Progress Notes (Signed)
Subjective:   Yvonne Andrews is a 80 y.o. female who presents for Medicare Annual (Subsequent) preventive examination.  Virtual Visit via Telephone Note  I connected with  Yvonne Andrews on 10/20/20 at  9:20 AM EDT by telephone and verified that I am speaking with the correct person using two identifiers.  Location: Patient: home Provider: [place] Persons participating in the virtual visit: [place]   I discussed the limitations, risks, security and privacy concerns of performing an evaluation and management service by telephone and the availability of in person appointments. The patient expressed understanding and agreed to proceed.  Interactive audio and video telecommunications were attempted between this nurse and patient, however failed, due to patient having technical difficulties OR patient did not have access to video capability.  We continued and completed visit with audio only.  Some vital signs may be absent or patient reported.   Clemetine Marker, LPN    Review of Systems   Cardiac Risk Factors include: advanced age (>40mn, >>41women);dyslipidemia;hypertension     Objective:    Today's Vitals   10/20/20 0924  BP: 123/73  Pulse: 62  Temp: (!) 96.3 F (35.7 C)  TempSrc: Temporal  Weight: 140 lb (63.5 kg)  Height: _0  (1.651 m)  PainSc: 2    Body mass index is 23.3 kg/m.  Advanced Directives 10/20/2020 10/20/2019 09/09/2018 09/06/2017 03/08/2017 01/21/2017 09/20/2016  Does Patient Have a Medical Advance Directive? Yes Yes Yes No No No No  Type of [place] will Living will;Healthcare Power of Attorney - - - -  Does patient want to make changes to medical advance directive? Yes (MAU/Ambulatory/Procedural Areas - Information given) - - - - - -  Copy of Healthcare Power of Attorney in Chart? - No - copy requested No - copy requested - - - -  Would patient like information  on creating a medical advance directive? - - - Yes (MAU/Ambulatory/Procedural Areas - Information given) - - -    Current Medications (verified) Outpatient Encounter Medications as of 10/20/2020  Medication Sig  . alendronate (FOSAMAX) 70 MG tablet Take 1 tablet (70 mg total) by mouth once a week. Take with a full glass of water on an empty stomach.  .Marland Kitchenaspirin EC 81 MG tablet Take 1 tablet (81 mg total) by mouth daily.  . Blood Pressure Monitoring (BLOOD PRESSURE KIT) KIT 1 each by Does not apply route daily.  . Cholecalciferol (VITAMIN D) 2000 units CAPS Take 1 capsule (2,000 Units total) by mouth daily.  . MULTIPLE VITAMINS-MINERALS ER PO Take 1 tablet by mouth daily.  . Nutritional Supplements (ESTROVEN PM PO) Take by mouth daily.  . rosuvastatin (CRESTOR) 20 MG tablet Take 1 tablet (20 mg total) by mouth daily.  . timolol (TIMOPTIC) 0.5 % ophthalmic solution Place 1 drop into both eyes 2 (two) times daily.   . traZODone (DESYREL) 50 MG tablet Take 0.5-1 tablets (25-50 mg total) by mouth at bedtime as needed for sleep.  . valsartan (DIOVAN) 80 MG tablet Take 1 tablet (80 mg total) by mouth daily.   No facility-administered encounter medications on file as of 10/20/2020.    Allergies (verified) Lipitor [atorvastatin]   History: Past Medical History:  Diagnosis Date  . Allergy   . Bilateral tinnitus   . Cataracts, bilateral   . Diverticulitis   . Hyperlipidemia   . Hypertension   . Macular degeneration due to cyst or hole,  left 2017  . Osteopenia   . Trigger finger of right hand   . Unspecified glaucoma    Past Surgical History:  Procedure Laterality Date  . TRIGGER FINGER RELEASE    . TUBAL LIGATION     Family History  Problem Relation Age of Onset  . Heart disease Mother   . CAD Father   . Heart attack Father 80  . Cancer Brother   . Heart disease Brother   . Breast cancer Niece        sisters daughter   Social History   Socioeconomic History  . Marital status:  Widowed    Spouse name: Charlotte Crumb  . Number of children: 4  . Years of education: Not on file  . Highest education level: 12th grade  Occupational History  . Occupation: Retired  Tobacco Use  . Smoking status: Never Smoker  . Smokeless tobacco: Never Used  . Tobacco comment: smoking cessation materials not required  Vaping Use  . Vaping Use: Never used  Substance and Sexual Activity  . Alcohol use: No    Alcohol/week: 0.0 standard drinks  . Drug use: No  . Sexual activity: Not Currently  Other Topics Concern  . Not on file  Social History Narrative   Pt lives alone. Husband diagnosed with lung cancer Fall of 2019, he died 10/05/2018   One child lives in town.   Son Rolan Lipa passed away 2019/12/05 in motorcycle accident.    Social Determinants of Health   Financial Resource Strain: Low Risk   . Difficulty of Paying Living Expenses: Not hard at all  Food Insecurity: No Food Insecurity  . Worried About Charity fundraiser in the Last Year: Never true  . Ran Out of Food in the Last Year: Never true  Transportation Needs: No Transportation Needs  . Lack of Transportation (Medical): No  . Lack of Transportation (Non-Medical): No  Physical Activity: Sufficiently Active  . Days of Exercise per Week: 5 days  . Minutes of Exercise per Session: 30 min  Stress: No Stress Concern Present  . Feeling of Stress : Not at all  Social Connections: Moderately Integrated  . Frequency of Communication with Friends and Family: More than three times a week  . Frequency of Social Gatherings with Friends and Family: More than three times a week  . Attends Religious Services: More than 4 times per year  . Active Member of Clubs or Organizations: Yes  . Attends Archivist Meetings: More than 4 times per year  . Marital Status: Widowed    Tobacco Counseling Counseling given: Not Answered Comment: smoking cessation materials not required   Clinical Intake:  Pre-visit preparation  completed: Yes  Pain : 0-10 Pain Score: 2  Pain Type: Chronic pain Pain Location: Back Pain Orientation: Right,Left,Lower Pain Descriptors / Indicators: Aching,Sore Pain Onset: More than a month ago Pain Frequency: Constant     BMI - recorded: 23.3 Nutritional Status: BMI of 19-24  Normal Nutritional Risks: None Diabetes: No  How often do you need to have someone help you when you read instructions, pamphlets, or other written materials from your doctor or pharmacy?: 1 - Never    Interpreter Needed?: No  Information entered by :: Clemetine Marker LPN   Activities of Daily Living In your present state of health, do you have any difficulty performing the following activities: 10/20/2020 07/28/2020  Hearing? N N  Comment declines hearing aids -  Vision? N N  Difficulty concentrating or  making decisions? N N  Walking or climbing stairs? N N  Dressing or bathing? N N  Doing errands, shopping? N N  Preparing Food and eating ? N -  Using the Toilet? N -  In the past six months, have you accidently leaked urine? N -  Do you have problems with loss of bowel control? N -  Managing your Medications? N -  Managing your Finances? N -  Housekeeping or managing your Housekeeping? N -  Some recent data might be hidden    Patient Care Team: Steele Sizer, MD as PCP - General (Family Medicine) [place], [place] as Consulting Physician (Optometry)  Indicate any recent Medical Services you may have received from other than [facility] providers in the past year (date may be approximate).     Assessment:   This is a routine wellness examination for [facility]  Hearing/Vision screen  Hearing Screening   _0  _1  _2  _3  _4  _5  _6  _7  _8   Right ear:           Left ear:           Comments: Pt denies hearing difficulty  Vision Screening Comments: Annual vision screenings with Dr. Michelene Heady  Dietary issues and exercise activities discussed: Current Exercise  Habits: Home exercise routine, Type of exercise: walking, Time (Minutes): 30, Frequency (Times/Week): 5, Weekly Exercise (Minutes/Week): 150, Intensity: Mild, Exercise limited by: orthopedic condition(s)  Goals    . DIET - INCREASE WATER INTAKE     Recommend drinking 6-8 glasses of water per day     . Exercise 150 min/wk Moderate Activity     Recommend to exercise for at least 150 minutes per week.      Depression Screen PHQ 2/9 Scores 10/20/2020 07/28/2020 01/26/2020 01/01/2020 11/02/2019 10/20/2019 09/25/2019  PHQ - 2 Score 0 0 0 1 0 0 0  PHQ- 9 Score - - 0 - - - -    Fall Risk Fall Risk  10/20/2020 07/28/2020 01/26/2020 11/02/2019 10/20/2019  Falls in the past year? 0 0 0 0 0  Number falls in past yr: 0 0 0 - 0  Injury with Fall? 0 0 0 - 0  Risk for fall due to : No Fall Risks - - - No Fall Risks  Follow up Falls prevention discussed Falls evaluation completed - - Falls prevention discussed    FALL RISK PREVENTION PERTAINING TO THE HOME:  Any stairs in or around the home? Yes  If so, are there any without handrails? No  Home free of loose throw rugs in walkways, pet beds, electrical cords, etc? Yes  Adequate lighting in your home to reduce risk of falls? Yes   ASSISTIVE DEVICES UTILIZED TO PREVENT FALLS:  Life alert? No  Use of a cane, walker or w/c? No  Grab bars in the bathroom? No  Shower chair or bench in shower? No  Elevated toilet seat or a handicapped toilet? No   TIMED UP AND GO:  Was the test performed? No . Telephonic visit.   Cognitive Function: Normal cognitive status assessed by direct observation by this Nurse Health Advisor. No abnormalities found.       6CIT Screen 10/20/2019 09/09/2018 09/06/2017  What Year? 0 points 0 points 0 points  What month? 0 points 0 points 0 points  What time? 0 points 0 points 0 points  Count back from 20 0 points 0 points 0 points  Months in reverse 0 points 0 points 0 points  Repeat phrase 2  points 4 points 4 points  Total Score _0 Immunizations Immunization History  Administered Date(s) Administered  . DT (Pediatric) 05/27/2007  . Fluad Quad(high Dose 65+) 03/25/2019  . Hepatitis B 10/30/2007, 12/03/2007, 05/04/2008  . Influenza, High Dose Seasonal PF 04/23/2017, 04/04/2018, 04/26/2020  . Influenza, Seasonal, Injecte, Preservative Fre 05/05/2013  . Influenza,inj,Quad PF,6+ Mos 05/04/2015  . Influenza-Unspecified 05/23/2014, 03/17/2016, 04/23/2017  . Moderna Sars-Covid-2 Vaccination 08/31/2019, 09/30/2019, 05/19/2020  . Pneumococcal Conjugate-13 09/17/2014  . Pneumococcal Polysaccharide-23 05/03/2008, 09/20/2016  . Tdap 09/27/2010  . Zoster 04/03/2012    TDAP status: Due, Education has been provided regarding the importance of this vaccine. Advised may receive this vaccine at local pharmacy or Health Dept. Aware to provide a copy of the vaccination record if obtained from local pharmacy or Health Dept. Verbalized acceptance and understanding.  Flu Vaccine status: Up to date  Pneumococcal vaccine status: Up to date  Covid-19 vaccine status: Completed vaccines  Qualifies for Shingles Vaccine? Yes   Zostavax completed Yes   Shingrix Completed?: No.    Education has been provided regarding the importance of this vaccine. Patient has been advised to call insurance company to determine out of pocket expense if they have not yet received this vaccine. Advised may also receive vaccine at local pharmacy or Health Dept. Verbalized acceptance and understanding.  Screening Tests Health Maintenance  Topic Date Due  . TETANUS/TDAP  09/26/2020  . MAMMOGRAM  02/24/2021  . INFLUENZA VACCINE  Completed  . DEXA SCAN  Completed  . COVID-19 Vaccine  Completed  . Hepatitis C Screening  Completed  . PNA vac Low Risk Adult  Completed  . HPV VACCINES  Aged Out    Health Maintenance  Health Maintenance Due  Topic Date Due  . TETANUS/TDAP  09/26/2020    Colorectal cancer screening: No longer required.    Mammogram status: Completed 02/25/20. Repeat every year  Bone Density status: Completed 02/25/20. Results reflect: Bone density results: OSTEOPENIA. Repeat every 2 years.  Lung Cancer Screening: (Low Dose CT Chest recommended if Age 38-80 years, 30 pack-year currently smoking OR have quit w/in 15years.) does not qualify.   Additional Screening:  Hepatitis C Screening: does qualify; Completed 01/26/20.   Vision Screening: Recommended annual ophthalmology exams for early detection of glaucoma and other disorders of the eye. Is the patient up to date with their annual eye exam?  Yes  Who is the provider or what is the name of the office in which the patient attends annual eye exams? Dr. [facility]   Dental Screening: Recommended annual dental exams for proper oral hygiene  Community Resource Referral / Chronic Care Management: CRR required this visit?  No   CCM required this visit?  No      Plan:     I have personally reviewed and noted the following in the patient's chart:   . Medical and social history . Use of alcohol, tobacco or illicit drugs  . Current medications and supplements . Functional ability and status . Nutritional status . Physical activity . Advanced directives . List of other physicians . Hospitalizations, surgeries, and ER visits in previous 12 months . Vitals . Screenings to include cognitive, depression, and falls . Referrals and appointments  In addition, I have reviewed and discussed with patient certain preventive protocols, quality metrics, and best practice recommendations. A written personalized care plan for preventive services as well as general preventive health recommendations were provided to patient.  Clemetine Marker, LPN   01/16/349   Nurse Notes: none

## 2020-11-25 ENCOUNTER — Ambulatory Visit: Payer: Self-pay

## 2020-12-07 NOTE — Chronic Care Management (AMB) (Signed)
Error Please Disregard

## 2020-12-12 ENCOUNTER — Other Ambulatory Visit: Payer: Self-pay | Admitting: Family Medicine

## 2020-12-12 DIAGNOSIS — M8580 Other specified disorders of bone density and structure, unspecified site: Secondary | ICD-10-CM

## 2020-12-12 NOTE — Telephone Encounter (Signed)
Requested Prescriptions  Pending Prescriptions Disp Refills  . alendronate (FOSAMAX) 70 MG tablet [Pharmacy Med Name: ALENDRONATE SODIUM 70 MG Tablet] 12 tablet 1    Sig: TAKE 1 TABLET ONE TIME WEEKLY WITH A FULL GLASS OF WATER ON AN EMPTY STOMACH     Endocrinology:  Bisphosphonates Passed - 12/12/2020  6:33 PM      Passed - Ca in normal range and within 360 days    Calcium  Date Value Ref Range Status  01/26/2020 9.9 8.6 - 10.4 mg/dL Final         Passed - Vitamin D in normal range and within 360 days    Vit D, 25-Hydroxy  Date Value Ref Range Status  01/26/2020 47 30 - 100 ng/mL Final    Comment:    Vitamin D Status         25-OH Vitamin D: . Deficiency:                    <20 ng/mL Insufficiency:             20 - 29 ng/mL Optimal:                 > or = 30 ng/mL . For 25-OH Vitamin D testing on patients on  D2-supplementation and patients for whom quantitation  of D2 and D3 fractions is required, the [employer]ureD(TM) 25-OH VIT D, (D2,D3), LC/MS/MS is recommended: order  code 19379 (patients >69yrs). See Note 1 . Note 1 . For additional information, please refer to  http://education.Quest[facility].com/faq/FAQ199  (This link is being provided for informational/ educational purposes only.)          Passed - Valid encounter within last 12 months    Recent Outpatient Visits          4 months ago Benign essential HTN   [facility] Alba Cory, MD   10 months ago Benign essential HTN   [facility] Alba Cory, MD   1 year ago Sensation of fullness in ear, bilateral   [facility] Jamelle Haring, MD   1 year ago Senile purpura [facility])   [facility] Alba Cory, MD   1 year ago Senile purpura [facility])   [facility] Alba Cory, MD      Future Appointments            In 1 month Alba Cory, MD [facility], [facility]   In 2 months  Alba Cory, MD [facility], [facility]   In 10 months  [facility], [facility]

## 2020-12-16 ENCOUNTER — Other Ambulatory Visit: Payer: Self-pay | Admitting: Family Medicine

## 2020-12-16 DIAGNOSIS — E785 Hyperlipidemia, unspecified: Secondary | ICD-10-CM

## 2020-12-16 NOTE — Telephone Encounter (Signed)
Requested Prescriptions  Pending Prescriptions Disp Refills  . rosuvastatin (CRESTOR) 20 MG tablet [Pharmacy Med Name: ROSUVASTATIN CALCIUM 20 MG Tablet] 90 tablet 1    Sig: TAKE 1 TABLET EVERY DAY     Cardiovascular:  Antilipid - Statins Passed - 12/16/2020  2:04 PM      Passed - Total Cholesterol in normal range and within 360 days    Cholesterol, Total  Date Value Ref Range Status  11/11/2015 230 (H) 100 - 199 mg/dL Final   Cholesterol  Date Value Ref Range Status  01/26/2020 175 <200 mg/dL Final         Passed - LDL in normal range and within 360 days    LDL Cholesterol (Calc)  Date Value Ref Range Status  01/26/2020 92 mg/dL (calc) Final    Comment:    Reference range: <100 . Desirable range <100 mg/dL for primary prevention;   <70 mg/dL for patients with CHD or diabetic patients  with > or = 2 CHD risk factors. Marland Kitchen LDL-C is now calculated using the Martin-Hopkins  calculation, which is a validated novel method providing  better accuracy than the Friedewald equation in the  estimation of LDL-C.  Horald Pollen et al. Lenox Ahr. 3536;144(31): 2061-2068  (http://education.[facility].com/faq/FAQ164)          Passed - HDL in normal range and within 360 days    HDL  Date Value Ref Range Status  01/26/2020 65 > OR = 50 mg/dL Final  54/00/8676 77 >19 mg/dL Final         Passed - Triglycerides in normal range and within 360 days    Triglycerides  Date Value Ref Range Status  01/26/2020 89 <150 mg/dL Final         Passed - Patient is not pregnant      Passed - Valid encounter within last 12 months    Recent Outpatient Visits          4 months ago Benign essential HTN   [facility] Alba [facility], MD   10 months ago Benign essential HTN   [facility] Alba Cory, MD   1 year ago Sensation of fullness in ear, bilateral   [facility] Jamelle Haring, MD   1 year ago Senile purpura [facility])   [facility] Alba Cory, MD   1 year ago Senile purpura [facility])   [facility] Alba Cory, MD      Future Appointments            In 1 month Carlynn Purl, Danna Hefty, MD [facility], [facility]   In 2 months Alba Cory, MD [facility], [facility]   In 10 months  [facility], [facility]

## 2020-12-26 DIAGNOSIS — H40053 Ocular hypertension, bilateral: Secondary | ICD-10-CM | POA: Diagnosis not present

## 2021-01-26 ENCOUNTER — Ambulatory Visit: Payer: Medicare PPO | Admitting: Family Medicine

## 2021-01-31 NOTE — Progress Notes (Signed)
Name: Yvonne Andrews   MRN: [id number]    DOB: 1941/05/31   Date:02/01/2021       Progress Note  Subjective  Chief Complaint  Follow Up  HPI  HTN: Patient has been compliant with medication and denies side effects, no chest pain, no palpitations, headaches, dizziness , she has intermittent  BLE edema. She was referred to  Dr. Saunders Revel - cardiologist in May 2018 for bradycardia, but had repeat EKG and was given reassurance. BP at home can spike to 150's, but most of the time is under control 120's-130's   Hyperlipidemia: Lipitor caused LFT"s to go up, Pravastatin was not strong enough, taking Rosuvastatin and denies  side effects. Last labs reviewed with patient and she is due for repeat labs.   Left lower back pain : she states she has intermittent pain on left lower back for years, but lately almost daily, described as aching/soreness when she moves around, no rashes. No bowel or bladder incontinence, no weakness and does not affects her gait Does not radiate down her leg . She has been helping at her church and bending forward more often lately    Prediabetes/fasting hyperglycemia:  hgbA1C has been between 5.8 % to 6.1% since 2016 last level in July 2021 was 6 %  She has been cutting down on carbs but explained just to replace with complex carbs. She denies polyphagia, polydipsia or polyuria. We will recheck labs today. She still drinks sweet tea    OA right knee: she states has pain above right knee when going up or down steps, she states grinding is stable, takes Ibuprofen occasionally, explained Tylenol is safer and to keep at 2 g per day nax dose    Vitamin D deficiency: taking otc supplementation, last level at goal    Osteopenia: last bone density done 01/2018 and showed mild progression of osteopenia, with FRAX of major fracture at 5% and hip at 1.1%, she is now on Alendronate since July 2019 , last bone density 02/2020 showed improvement    Senile purpura: she states she bruises easily on  arms or legs. Stable    Patient Active Problem List   Diagnosis Date Noted   Unspecified glaucoma 06/24/2017   Diverticulosis 03/08/2017   Vitamin D deficiency 05/29/2016   Intraocular pressure increase 07/04/2015   Bilateral tinnitus 07/04/2015   Benign essential HTN 12/31/2014   Carpal tunnel syndrome 12/31/2014   Dyslipidemia 12/31/2014   Ovarian failure 12/31/2014   Osteopenia 12/31/2014   Allergic rhinitis 12/31/2014   Arthritis of knee, degenerative 12/31/2014   Acquired trigger finger 12/31/2014   Cataracts, bilateral 12/31/2014   Fasting hyperglycemia 12/31/2014   LBP (low back pain) 05/27/2007    Past Surgical History:  Procedure Laterality Date   TRIGGER FINGER RELEASE     TUBAL LIGATION      Family History  Problem Relation Age of Onset   Heart disease Mother    CAD Father    Heart attack Father 57   Cancer Brother    Heart disease Brother    Breast cancer Niece        sisters daughter    Social History   Tobacco Use   Smoking status: Never   Smokeless tobacco: Never   Tobacco comments:    smoking cessation materials not required  Substance Use Topics   Alcohol use: No    Alcohol/week: 0.0 standard drinks     Current Outpatient Medications:    alendronate (FOSAMAX) 70 MG tablet, TAKE  1 TABLET ONE TIME WEEKLY WITH A FULL GLASS OF WATER ON AN EMPTY STOMACH, Disp: 12 tablet, Rfl: 1   aspirin EC 81 MG tablet, Take 1 tablet (81 mg total) by mouth daily., Disp: 30 tablet, Rfl: 0   Blood Pressure Monitoring (BLOOD PRESSURE KIT) KIT, 1 each by Does not apply route daily., Disp: 1 kit, Rfl: 0   Cholecalciferol (VITAMIN D) 2000 units CAPS, Take 1 capsule (2,000 Units total) by mouth daily., Disp: 30 capsule, Rfl: 0   MULTIPLE VITAMINS-MINERALS ER PO, Take 1 tablet by mouth daily., Disp: , Rfl:    Nutritional Supplements (ESTROVEN PM PO), Take by mouth daily., Disp: , Rfl:    rosuvastatin (CRESTOR) 20 MG tablet, TAKE 1 TABLET EVERY DAY, Disp: 90 tablet, Rfl:  1   timolol (TIMOPTIC) 0.5 % ophthalmic solution, Place 1 drop into both eyes 2 (two) times daily. , Disp: , Rfl:    traZODone (DESYREL) 50 MG tablet, Take 0.5-1 tablets (25-50 mg total) by mouth at bedtime as needed for sleep., Disp: 90 tablet, Rfl: 1   valsartan (DIOVAN) 80 MG tablet, Take 1 tablet (80 mg total) by mouth daily., Disp: 90 tablet, Rfl: 1  Allergies  Allergen Reactions   Lipitor [Atorvastatin] Other (See Comments)    Elevation of LFT's    I personally reviewed active problem list, medication list, allergies, family history, social history, health maintenance with the patient/caregiver today.   ROS  Constitutional: Negative for fever or weight change.  Respiratory: Negative for cough and shortness of breath.   Cardiovascular: Negative for chest pain or palpitations.  Gastrointestinal: Negative for abdominal pain, no bowel changes.  Musculoskeletal: Negative for gait problem or joint swelling.  Skin: Negative for rash.  Neurological: Negative for dizziness or headache.  No other specific complaints in a complete review of systems (except as listed in HPI above).   Objective  Vitals:   02/01/21 0759  BP: 120/64  Pulse: 71  Resp: 16  Temp: 98.6 F (37 C)  TempSrc: Oral  SpO2: 98%  Weight: 144 lb (65.3 kg)  Height: 5' 5"  (1.651 m)    Body mass index is 23.96 kg/m.  Physical Exam  Constitutional: Patient appears well-developed and well-nourished.  No distress.  HEENT: head atraumatic, normocephalic, pupils equal and reactive to light, neck supple Cardiovascular: Normal rate, regular rhythm and normal heart sounds.  No murmur heard. No BLE edema. Pulmonary/Chest: Effort normal and breath sounds normal. No respiratory distress. Abdominal: Soft.  There is no tenderness. Psychiatric: Patient has a normal mood and affect. behavior is normal. Judgment and thought content normal.  Muscular Skeletal: negative straight leg raise, tender to palpation of left lower  back Skin: scab on left upper back , area she removed tick bite about one month ago  PHQ2/9: Depression screen [facility] 2/9 02/01/2021 10/20/2020 07/28/2020 01/26/2020 01/01/2020  Decreased Interest 0 0 0 0 0  Down, Depressed, Hopeless 0 0 0 0 1  PHQ - 2 Score 0 0 0 0 1  Altered sleeping - - - 0 -  Tired, decreased energy - - - 0 -  Change in appetite - - - 0 -  Feeling bad or failure about yourself  - - - 0 -  Trouble concentrating - - - 0 -  Moving slowly or fidgety/restless - - - 0 -  Suicidal thoughts - - - 0 -  PHQ-9 Score - - - 0 -  Difficult doing work/chores - - - - -  Some recent  data might be hidden    phq 9 is negative   Fall Risk: Fall Risk  02/01/2021 10/20/2020 07/28/2020 01/26/2020 11/02/2019  Falls in the past year? 0 0 0 0 0  Number falls in past yr: 0 0 0 0 -  Injury with Fall? 0 0 0 0 -  Risk for fall due to : - No Fall Risks - - -  Follow up - Falls prevention discussed Falls evaluation completed - -      Functional Status Survey: Is the patient deaf or have difficulty hearing?: No Does the patient have difficulty seeing, even when wearing glasses/contacts?: No Does the patient have difficulty concentrating, remembering, or making decisions?: No Does the patient have difficulty walking or climbing stairs?: No Does the patient have difficulty dressing or bathing?: No Does the patient have difficulty doing errands alone such as visiting a doctor's office or shopping?: No    Assessment & Plan  1. Dyslipidemia  - Lipid panel  2. Benign essential HTN  - valsartan (DIOVAN) 80 MG tablet; Take 1 tablet (80 mg total) by mouth daily.  Dispense: 90 tablet; Refill: 1 - COMPLETE METABOLIC PANEL WITH GFR - CBC with Differential/Platelet  3. Fasting hyperglycemia  - Hemoglobin A1c  4. Senile purpura ([place])   5. Primary osteoarthritis of right knee   6. Vitamin D deficiency   7. Primary insomnia   8. Osteopenia of multiple sites   9. Breast cancer screening  by mammogram  - MM 3D SCREEN BREAST BILATERAL; Future  10. Intermittent low back pain  - tiZANidine (ZANAFLEX) 2 MG tablet; Take 1 tablet (2 mg total) by mouth at bedtime.  Dispense: 30 tablet; Refill: 0

## 2021-02-01 ENCOUNTER — Other Ambulatory Visit: Payer: Self-pay

## 2021-02-01 ENCOUNTER — Ambulatory Visit: Payer: Medicare PPO | Admitting: Family Medicine

## 2021-02-01 ENCOUNTER — Encounter: Payer: Self-pay | Admitting: Family Medicine

## 2021-02-01 VITALS — BP 120/64 | HR 71 | Temp 98.6°F | Resp 16 | Ht 65.0 in | Wt 144.0 lb

## 2021-02-01 DIAGNOSIS — E785 Hyperlipidemia, unspecified: Secondary | ICD-10-CM | POA: Diagnosis not present

## 2021-02-01 DIAGNOSIS — D692 Other nonthrombocytopenic purpura: Secondary | ICD-10-CM | POA: Diagnosis not present

## 2021-02-01 DIAGNOSIS — Z1231 Encounter for screening mammogram for malignant neoplasm of breast: Secondary | ICD-10-CM

## 2021-02-01 DIAGNOSIS — R7301 Impaired fasting glucose: Secondary | ICD-10-CM

## 2021-02-01 DIAGNOSIS — M1711 Unilateral primary osteoarthritis, right knee: Secondary | ICD-10-CM

## 2021-02-01 DIAGNOSIS — E559 Vitamin D deficiency, unspecified: Secondary | ICD-10-CM

## 2021-02-01 DIAGNOSIS — F5101 Primary insomnia: Secondary | ICD-10-CM | POA: Diagnosis not present

## 2021-02-01 DIAGNOSIS — I10 Essential (primary) hypertension: Secondary | ICD-10-CM | POA: Diagnosis not present

## 2021-02-01 DIAGNOSIS — M545 Low back pain, unspecified: Secondary | ICD-10-CM

## 2021-02-01 DIAGNOSIS — M8589 Other specified disorders of bone density and structure, multiple sites: Secondary | ICD-10-CM

## 2021-02-01 MED ORDER — TIZANIDINE HCL 2 MG PO TABS: 2.0000 mg | ORAL_TABLET | Freq: Every day | ORAL | 0 refills | Status: DC

## 2021-02-01 MED ORDER — VALSARTAN 80 MG PO TABS: 80.0000 mg | ORAL_TABLET | Freq: Every day | ORAL | 1 refills | Status: DC

## 2021-02-02 LAB — LIPID PANEL
Cholesterol: 171 mg/dL (ref <200)
HDL: 68 mg/dL (ref >=50)
LDL Cholesterol (Calc): 85 mg/dL (calc)
Non-HDL Cholesterol (Calc): 103 mg/dL (calc) (ref <130)
Total CHOL/HDL Ratio: 2.5 (calc) (ref <5.0)
Triglycerides: 85 mg/dL (ref <150)

## 2021-02-02 LAB — CBC WITH DIFFERENTIAL/PLATELET
Absolute Monocytes: 445 cells/uL (ref 200–950)
Basophils Absolute: 53 cells/uL (ref 0–200)
Basophils Relative: 1 %
Eosinophils Absolute: 313 cells/uL (ref 15–500)
Eosinophils Relative: 5.9 %
HCT: 38.9 % (ref 35.0–45.0)
Hemoglobin: 12.6 g/dL (ref 11.7–15.5)
Lymphs Abs: 1654 cells/uL (ref 850–3900)
MCH: 30.3 pg (ref 27.0–33.0)
MCHC: 32.4 g/dL (ref 32.0–36.0)
MCV: 93.5 fL (ref 80.0–100.0)
MPV: 9.6 fL (ref 7.5–12.5)
Monocytes Relative: 8.4 %
Neutro Abs: 2836 cells/uL (ref 1500–7800)
Neutrophils Relative %: 53.5 %
Platelets: 196 10*3/uL (ref 140–400)
RBC: 4.16 10*6/uL (ref 3.80–5.10)
RDW: 13.2 % (ref 11.0–15.0)
Total Lymphocyte: 31.2 %
WBC: 5.3 10*3/uL (ref 3.8–10.8)

## 2021-02-02 LAB — COMPLETE METABOLIC PANEL WITH GFR
AG Ratio: 2.1 (calc) (ref 1.0–2.5)
ALT: 13 U/L (ref 6–29)
AST: 20 U/L (ref 10–35)
Albumin: 4.5 g/dL (ref 3.6–5.1)
Alkaline phosphatase (APISO): 51 U/L (ref 37–153)
BUN: 11 mg/dL (ref 7–25)
CO2: 28 mmol/L (ref 20–32)
Calcium: 9.6 mg/dL (ref 8.6–10.4)
Chloride: 108 mmol/L (ref 98–110)
Creat: 0.93 mg/dL (ref 0.60–1.00)
Globulin: 2.1 g/dL (calc) (ref 1.9–3.7)
Glucose, Bld: 100 mg/dL — ABNORMAL HIGH (ref 65–99)
Potassium: 5.2 mmol/L (ref 3.5–5.3)
Sodium: 142 mmol/L (ref 135–146)
Total Bilirubin: 0.5 mg/dL (ref 0.2–1.2)
Total Protein: 6.6 g/dL (ref 6.1–8.1)
eGFR: 63 mL/min/{1.73_m2} (ref >=60)

## 2021-02-02 LAB — HEMOGLOBIN A1C
Hgb A1c MFr Bld: 5.9 % of total Hgb — ABNORMAL HIGH (ref <5.7)
Mean Plasma Glucose: 123 mg/dL
eAG (mmol/L): 6.8 mmol/L

## 2021-02-03 NOTE — Progress Notes (Signed)
Left vm with results

## 2021-02-24 ENCOUNTER — Other Ambulatory Visit: Payer: Self-pay | Admitting: Family Medicine

## 2021-02-24 DIAGNOSIS — M545 Low back pain, unspecified: Secondary | ICD-10-CM

## 2021-02-27 ENCOUNTER — Other Ambulatory Visit: Payer: Self-pay

## 2021-02-27 ENCOUNTER — Ambulatory Visit
Admission: RE | Admit: 2021-02-27 | Discharge: 2021-02-27 | Disposition: A | Discharge status: Home / Self Care | Payer: Medicare PPO | Source: Ambulatory Visit | Attending: Family Medicine | Admitting: Family Medicine

## 2021-02-27 DIAGNOSIS — Z1231 Encounter for screening mammogram for malignant neoplasm of breast: Secondary | ICD-10-CM | POA: Insufficient documentation

## 2021-02-28 NOTE — Progress Notes (Signed)
Name: Yvonne Andrews   MRN: [id number]    DOB: [birth year]   Date:03/01/2021       Progress Note  Subjective  Chief Complaint  Annual Exam  HPI  Patient presents for annual CPE.  Excoriation on her arm, she likes picking up sticks in her yard and got a small laceration on right arm about one week ago, she is not up to date with Tdap and will get it today  Diet: rich in vegetables, fish twice a week, advised high calcium diet  Exercise: works in her yard, goes for walks.    Flowsheet Row Clinical Support from 10/20/2020 in [facility]  AUDIT-C Score 0      Depression: Phq 9 is  negative Depression screen [place] 2/9 03/01/2021 02/01/2021 10/20/2020 07/28/2020 01/26/2020  Decreased Interest 0 0 0 0 0  Down, Depressed, Hopeless 0 0 0 0 0  PHQ - 2 Score 0 0 0 0 0  Altered sleeping - - - - 0  Tired, decreased energy - - - - 0  Change in appetite - - - - 0  Feeling bad or failure about yourself  - - - - 0  Trouble concentrating - - - - 0  Moving slowly or fidgety/restless - - - - 0  Suicidal thoughts - - - - 0  PHQ-9 Score - - - - 0  Difficult doing work/chores - - - - -  Some recent data might be hidden   Hypertension: BP Readings from Last 3 Encounters:  03/01/21 126/68  02/01/21 120/64  10/20/20 123/73   Obesity: Wt Readings from Last 3 Encounters:  03/01/21 141 lb (64 kg)  02/01/21 144 lb (65.3 kg)  10/20/20 140 lb (63.5 kg)   BMI Readings from Last 3 Encounters:  03/01/21 23.46 kg/m  02/01/21 23.96 kg/m  10/20/20 23.30 kg/m     Vaccines:   Shingrix: 78-64 yo and ask insurance if covered when patient above 78 yo Pneumonia: educated and discussed with patient. Flu: educated and discussed with patient.  Hep C Screening: 01/26/20 STD testing and prevention (HIV/chl/gon/syphilis): N/A Intimate partner violence:negative Sexual History :not sexually active  Menstrual History/LMP/Abnormal Bleeding: discussed post menopausal bleeding  Incontinence  Symptoms: only when she holds too long   Breast cancer:  - Last Mammogram: Ordered 02/01/21 - BRCA gene screening: N/A  Osteoporosis: Discussed high calcium and vitamin D supplementation, weight bearing exercises  Cervical cancer screening: N/A  Skin cancer: Discussed monitoring for atypical lesions  Colorectal cancer: last cologuard done 2020, we will discuss it next year and re-consider repeating it  Lung cancer: Low Dose CT Chest recommended if Age 78-80 years, 20 pack-year currently smoking OR have quit w/in 15years. Patient does not qualify.   ECG: 11/28/16  Advanced Care Planning: A voluntary discussion about advance care planning including the explanation and discussion of advance directives.  Discussed health care proxy and Living will, and the patient was able to identify a health care proxy as daughter: Dorma Russell  Lipids: Lab Results  Component Value Date   CHOL 171 02/01/2021   CHOL 175 01/26/2020   CHOL 216 (H) 01/19/2019   Lab Results  Component Value Date   HDL 68 02/01/2021   HDL 65 01/26/2020   HDL 77 01/19/2019   Lab Results  Component Value Date   LDLCALC 85 02/01/2021   LDLCALC 92 01/26/2020   LDLCALC 116 (H) 01/19/2019   Lab Results  Component Value Date   TRIG 85 02/01/2021  TRIG 89 01/26/2020   TRIG 122 01/19/2019   Lab Results  Component Value Date   CHOLHDL 2.5 02/01/2021   CHOLHDL 2.7 01/26/2020   CHOLHDL 2.8 01/19/2019   No results found for: LDLDIRECT  Glucose: Glucose, Bld  Date Value Ref Range Status  02/01/2021 100 (H) 65 - 99 mg/dL Final    Comment:    .            Fasting reference interval . For someone without known diabetes, a glucose value between 100 and 125 mg/dL is consistent with prediabetes and should be confirmed with a follow-up test. .   01/26/2020 98 65 - 99 mg/dL Final    Comment:    .            Fasting reference interval .   01/19/2019 95 65 - 99 mg/dL Final    Comment:    .            Fasting  reference interval .     Patient Active Problem List   Diagnosis Date Noted   Unspecified glaucoma 06/24/2017   Diverticulosis 03/08/2017   Vitamin D deficiency 05/29/2016   Intraocular pressure increase 07/04/2015   Bilateral tinnitus 07/04/2015   Benign essential HTN 12/31/2014   Carpal tunnel syndrome 12/31/2014   Dyslipidemia 12/31/2014   Ovarian failure 12/31/2014   Osteopenia 12/31/2014   Allergic rhinitis 12/31/2014   Arthritis of knee, degenerative 12/31/2014   Acquired trigger finger 12/31/2014   Cataracts, bilateral 12/31/2014   Fasting hyperglycemia 12/31/2014   LBP (low back pain) 05/27/2007    Past Surgical History:  Procedure Laterality Date   TRIGGER FINGER RELEASE     TUBAL LIGATION      Family History  Problem Relation Age of Onset   Heart disease Mother    CAD Father    Heart attack Father 60   Cancer Brother    Heart disease Brother    Breast cancer Niece        sisters daughter    Social History   Socioeconomic History   Marital status: Widowed    Spouse name: Johnny   Number of children: 4   Years of education: Not on file   Highest education level: 12th grade  Occupational History   Occupation: Retired  Tobacco Use   Smoking status: Never   Smokeless tobacco: Never   Tobacco comments:    smoking cessation materials not required  Vaping Use   Vaping Use: Never used  Substance and Sexual Activity   Alcohol use: No    Alcohol/week: 0.0 standard drinks   Drug use: No   Sexual activity: Not Currently  Other Topics Concern   Not on file  Social History Narrative   Pt lives alone. Husband diagnosed with lung cancer Fall of 2019, he died 10-20-18   One child lives in town.   Son Rolan Lipa passed away 12/20/2019 in motorcycle accident.    Social Determinants of Health   Financial Resource Strain: Low Risk    Difficulty of Paying Living Expenses: Not hard at all  Food Insecurity: No Food Insecurity   Worried About Sales executive in the Last Year: Never true   [place] in the Last Year: Never true  Transportation Needs: No Transportation Needs   Lack of Transportation (Medical): No   Lack of Transportation (Non-Medical): No  Physical Activity: Insufficiently Active   Days of Exercise per Week: 5 days   Minutes of  Exercise per Session: 20 min  Stress: No Stress Concern Present   Feeling of Stress : Not at all  Social Connections: Moderately Integrated   Frequency of Communication with Friends and Family: More than three times a week   Frequency of Social Gatherings with Friends and Family: More than three times a week   Attends Religious Services: More than 4 times per year   Active Member of [employer] or Organizations: Yes   Attends Archivist Meetings: More than 4 times per year   Marital Status: Widowed  Human resources officer Violence: Not At Risk   Fear of Current or Ex-Partner: No   Emotionally Abused: No   Physically Abused: No   Sexually Abused: No     Current Outpatient Medications:    alendronate (FOSAMAX) 70 MG tablet, TAKE 1 TABLET ONE TIME WEEKLY WITH A FULL GLASS OF WATER ON AN EMPTY STOMACH, Disp: 12 tablet, Rfl: 1   aspirin EC 81 MG tablet, Take 1 tablet (81 mg total) by mouth daily., Disp: 30 tablet, Rfl: 0   Blood Pressure Monitoring (BLOOD PRESSURE KIT) KIT, 1 each by Does not apply route daily., Disp: 1 kit, Rfl: 0   Cholecalciferol (VITAMIN D) 2000 units CAPS, Take 1 capsule (2,000 Units total) by mouth daily., Disp: 30 capsule, Rfl: 0   MULTIPLE VITAMINS-MINERALS ER PO, Take 1 tablet by mouth daily., Disp: , Rfl:    Nutritional Supplements (ESTROVEN PM PO), Take by mouth daily., Disp: , Rfl:    rosuvastatin (CRESTOR) 20 MG tablet, TAKE 1 TABLET EVERY DAY, Disp: 90 tablet, Rfl: 1   timolol (TIMOPTIC) 0.5 % ophthalmic solution, Place 1 drop into both eyes 2 (two) times daily. , Disp: , Rfl:    tiZANidine (ZANAFLEX) 2 MG tablet, TAKE 1 TABLET BY MOUTH AT BEDTIME., Disp: 30  tablet, Rfl: 0   traZODone (DESYREL) 50 MG tablet, Take 0.5-1 tablets (25-50 mg total) by mouth at bedtime as needed for sleep., Disp: 90 tablet, Rfl: 1   valsartan (DIOVAN) 80 MG tablet, Take 1 tablet (80 mg total) by mouth daily., Disp: 90 tablet, Rfl: 1  Allergies  Allergen Reactions   Lipitor [Atorvastatin] Other (See Comments)    Elevation of LFT's     ROS  Constitutional: Negative for fever or weight change.  Respiratory: Negative for cough and shortness of breath.   Cardiovascular: Negative for chest pain or palpitations.  Gastrointestinal: Negative for abdominal pain, no bowel changes.  Musculoskeletal: Negative for gait problem or joint swelling.  Skin: Negative for rash.  Neurological: Negative for dizziness or headache.  No other specific complaints in a complete review of systems (except as listed in HPI above).    Objective  Vitals:   03/01/21 0746  BP: 126/68  Pulse: 60  Resp: 16  Temp: 98.2 F (36.8 C)  SpO2: 99%  Weight: 141 lb (64 kg)  Height: 5' 5"  (1.651 m)    Body mass index is 23.46 kg/m.  Physical Exam  Constitutional: Patient appears well-developed and well-nourished. No distress.  HENT: Head: Normocephalic and atraumatic. Ears: B TMs ok, no erythema or effusion; Nose: Nose normal. Mouth/Throat: not done  Eyes: Conjunctivae and EOM are normal. Pupils are equal, round, and reactive to light. No scleral icterus.  Neck: Normal range of motion. Neck supple. No JVD present. No thyromegaly present.  Cardiovascular: Normal rate, regular rhythm and normal heart sounds.  No murmur heard. No BLE edema. Pulmonary/Chest: Effort normal and breath sounds normal. No respiratory distress. Abdominal:  Soft. Bowel sounds are normal, no distension. There is no tenderness. no masses Breast: no lumps or masses, no nipple discharge or rashes FEMALE GENITALIA:  Not done  RECTAL: not done  Musculoskeletal: Normal range of motion, no joint effusions. No gross  deformities Neurological: he is alert and oriented to person, place, and time. No cranial nerve deficit. Coordination, balance, strength, speech and gait are normal.  Skin: Skin is warm and dry. No rash noted. No erythema. Small excoriation on right forearm Psychiatric: Patient has a normal mood and affect. behavior is normal. Judgment and thought content normal.   Recent Results (from the past 2160 hour(s))  COMPLETE METABOLIC PANEL WITH GFR     Status: Abnormal   Collection Time: 02/01/21  8:34 AM  Result Value Ref Range   Glucose, Bld 100 (H) 65 - 99 mg/dL    Comment: .            Fasting reference interval . For someone without known diabetes, a glucose value between 100 and 125 mg/dL is consistent with prediabetes and should be confirmed with a follow-up test. .    BUN 11 7 - 25 mg/dL   Creat 0.93 0.60 - 1.00 mg/dL   eGFR 63 > OR = 60 mL/min/1.50m    Comment: The eGFR is based on the CKD-EPI 2021 equation. To calculate  the new eGFR from a previous Creatinine or Cystatin C result, go to https://www.kidney.org/professionals/ kdoqi/gfr%5Fcalculator    BUN/Creatinine Ratio NOT APPLICABLE 6 - 22 (calc)   Sodium 142 135 - 146 mmol/L   Potassium 5.2 3.5 - 5.3 mmol/L   Chloride 108 98 - 110 mmol/L   CO2 28 20 - 32 mmol/L   Calcium 9.6 8.6 - 10.4 mg/dL   Total Protein 6.6 6.1 - 8.1 g/dL   Albumin 4.5 3.6 - 5.1 g/dL   Globulin 2.1 1.9 - 3.7 g/dL (calc)   AG Ratio 2.1 1.0 - 2.5 (calc)   Total Bilirubin 0.5 0.2 - 1.2 mg/dL   Alkaline phosphatase (APISO) 51 37 - 153 U/L   AST 20 10 - 35 U/L   ALT 13 6 - 29 U/L  CBC with Differential/Platelet     Status: None   Collection Time: 02/01/21  8:34 AM  Result Value Ref Range   WBC 5.3 3.8 - 10.8 Thousand/uL   RBC 4.16 3.80 - 5.10 Million/uL   Hemoglobin 12.6 11.7 - 15.5 g/dL   HCT 38.9 35.0 - 45.0 %   MCV 93.5 80.0 - 100.0 fL   MCH 30.3 27.0 - 33.0 pg   MCHC 32.4 32.0 - 36.0 g/dL   RDW 13.2 11.0 - 15.0 %   Platelets 196 140 -  400 Thousand/uL   MPV 9.6 7.5 - 12.5 fL   Neutro Abs 2,836 1,500 - 7,800 cells/uL   Lymphs Abs 1,654 850 - 3,900 cells/uL   Absolute Monocytes 445 200 - 950 cells/uL   Eosinophils Absolute 313 15 - 500 cells/uL   Basophils Absolute 53 0 - 200 cells/uL   Neutrophils Relative % 53.5 %   Total Lymphocyte 31.2 %   Monocytes Relative 8.4 %   Eosinophils Relative 5.9 %   Basophils Relative 1.0 %  Lipid panel     Status: None   Collection Time: 02/01/21  8:34 AM  Result Value Ref Range   Cholesterol 171 <200 mg/dL   HDL 68 > OR = 50 mg/dL   Triglycerides 85 <150 mg/dL   LDL Cholesterol (Calc) 85 mg/dL (calc)  Comment: Reference range: <100 . Desirable range <100 mg/dL for primary prevention;   <70 mg/dL for patients with CHD or diabetic patients  with > or = 2 CHD risk factors. Marland Kitchen LDL-C is now calculated using the Martin-Hopkins  calculation, which is a validated novel method providing  better accuracy than the Friedewald equation in the  estimation of LDL-C.  Cresenciano Genre et al. Annamaria Helling. 1751;025(85): 2061-2068  (http://education.[facility].com/faq/FAQ164)    Total CHOL/HDL Ratio 2.5 <5.0 (calc)   Non-HDL Cholesterol (Calc) 103 <130 mg/dL (calc)    Comment: For patients with diabetes plus 1 major ASCVD risk  factor, treating to a non-HDL-C goal of <100 mg/dL  (LDL-C of <70 mg/dL) is considered a therapeutic  option.   Hemoglobin A1c     Status: Abnormal   Collection Time: 02/01/21  8:34 AM  Result Value Ref Range   Hgb A1c MFr Bld 5.9 (H) <5.7 % of total Hgb    Comment: For someone without known diabetes, a hemoglobin  A1c value between 5.7% and 6.4% is consistent with prediabetes and should be confirmed with a  follow-up test. . For someone with known diabetes, a value <7% indicates that their diabetes is well controlled. A1c targets should be individualized based on duration of diabetes, age, comorbid conditions, and other considerations. . This assay result is  consistent with an increased risk of diabetes. . Currently, no consensus exists regarding use of hemoglobin A1c for diagnosis of diabetes for children. .    Mean Plasma Glucose 123 mg/dL   eAG (mmol/L) 6.8 mmol/L     Fall Risk: Fall Risk  03/01/2021 02/01/2021 10/20/2020 07/28/2020 01/26/2020  Falls in the past year? 0 0 0 0 0  Number falls in past yr: 0 0 0 0 0  Injury with Fall? 0 0 0 0 0  Risk for fall due to : - - No Fall Risks - -  Follow up - - Falls prevention discussed Falls evaluation completed -     Functional Status Survey: Is the patient deaf or have difficulty hearing?: No Does the patient have difficulty seeing, even when wearing glasses/contacts?: No Does the patient have difficulty concentrating, remembering, or making decisions?: No Does the patient have difficulty walking or climbing stairs?: No Does the patient have difficulty dressing or bathing?: No Does the patient have difficulty doing errands alone such as visiting a doctor's office or shopping?: No   Assessment & Plan  1. Well adult exam   2. Need for shingles vaccine  - Zoster Vaccine Adjuvanted [facility]) injection; Inject 0.5 mLs into the muscle once for 1 dose.  Dispense: 0.5 mL; Refill: 1  3. Excoriation  - Tdap vaccine greater than or equal to 7yo IM  4. Need for Tdap vaccination  - Tdap vaccine greater than or equal to 7yo IM    -USPSTF grade A and B recommendations reviewed with patient; age-appropriate recommendations, preventive care, screening tests, etc discussed and encouraged; healthy living encouraged; see AVS for patient education given to patient -Discussed importance of 150 minutes of physical activity weekly, eat two servings of fish weekly, eat one serving of tree nuts ( cashews, pistachios, pecans, almonds.Marland Kitchen) every other day, eat 6 servings of fruit/vegetables daily and drink plenty of water and avoid sweet beverages.

## 2021-02-28 NOTE — Patient Instructions (Signed)
Preventive Care 78 Years and Older, Female Preventive care refers to lifestyle choices and visits with your health care provider that can promote health and wellness. This includes: A yearly physical exam. This is also called an annual wellness visit. Regular dental and eye exams. Immunizations. Screening for certain conditions. Healthy lifestyle choices, such as: Eating a healthy diet. Getting regular exercise. Not using drugs or products that contain nicotine and tobacco. Limiting alcohol use. What can I expect for my preventive care visit? Physical exam Your health care provider will check your: Height and weight. These may be used to calculate your BMI (body mass index). BMI is a measurement that tells if you are at a healthy weight. Heart rate and blood pressure. Body temperature. Skin for abnormal spots. Counseling Your health care provider may ask you questions about your: Past medical problems. Family's medical history. Alcohol, tobacco, and drug use. Emotional well-being. Home life and relationship well-being. Sexual activity. Diet, exercise, and sleep habits. History of falls. Memory and ability to understand (cognition). Work and work Statistician. Pregnancy and menstrual history. Access to firearms. What immunizations do I need?  Vaccines are usually given at various ages, according to a schedule. Your health care provider will recommend vaccines for you based on your age, medicalhistory, and lifestyle or other factors, such as travel or where you work. What tests do I need? Blood tests Lipid and cholesterol levels. These may be checked every 5 years, or more often depending on your overall health. Hepatitis C test. Hepatitis B test. Screening Lung cancer screening. You may have this screening every year starting at age 78 if you have a 30-pack-year history of smoking and currently smoke or have quit within the past 15 years. Colorectal cancer screening. All  adults should have this screening starting at age 78 and continuing until age 65. Your health care provider may recommend screening at age 78 if you are at increased risk. You will have tests every 1-10 years, depending on your results and the type of screening test. Diabetes screening. This is done by checking your blood sugar (glucose) after you have not eaten for a while (fasting). You may have this done every 1-3 years. Mammogram. This may be done every 1-2 years. Talk with your health care provider about how often you should have regular mammograms. Abdominal aortic aneurysm (AAA) screening. You may need this if you are a current or former smoker. BRCA-related cancer screening. This may be done if you have a family history of breast, ovarian, tubal, or peritoneal cancers. Other tests STD (sexually transmitted disease) testing, if you are at risk. Bone density scan. This is done to screen for osteoporosis. You may have this done starting at age 78. Talk with your health care provider about your test results, treatment options,and if necessary, the need for more tests. Follow these instructions at home: Eating and drinking  Eat a diet that includes fresh fruits and vegetables, whole grains, lean protein, and low-fat dairy products. Limit your intake of foods with high amounts of sugar, saturated fats, and salt. Take vitamin and mineral supplements as recommended by your health care provider. Do not drink alcohol if your health care provider tells you not to drink. If you drink alcohol: Limit how much you have to 0-1 drink a day. Be aware of how much alcohol is in your drink. In the [place] one drink equals one 12 oz bottle of beer (355 mL), one 5 oz glass of wine (148 mL), or one 1  oz glass of hard liquor (44 mL).  Lifestyle Take daily care of your teeth and gums. Brush your teeth every morning and night with fluoride toothpaste. Floss one time each day. Stay active. Exercise for at  least 30 minutes 5 or more days each week. Do not use any products that contain nicotine or tobacco, such as cigarettes, e-cigarettes, and chewing tobacco. If you need help quitting, ask your health care provider. Do not use drugs. If you are sexually active, practice safe sex. Use a condom or other form of protection in order to prevent STIs (sexually transmitted infections). Talk with your health care provider about taking a low-dose aspirin or statin. Find healthy ways to cope with stress, such as: Meditation, yoga, or listening to music. Journaling. Talking to a trusted person. Spending time with friends and family. Safety Always wear your seat belt while driving or riding in a vehicle. Do not drive: If you have been drinking alcohol. Do not ride with someone who has been drinking. When you are tired or distracted. While texting. Wear a helmet and other protective equipment during sports activities. If you have firearms in your house, make sure you follow all gun safety procedures. What's next? Visit your health care provider once a year for an annual wellness visit. Ask your health care provider how often you should have your eyes and teeth checked. Stay up to date on all vaccines. This information is not intended to replace advice given to you by your health care provider. Make sure you discuss any questions you have with your healthcare provider. Document Revised: 06/29/2020 Document Reviewed: 07/03/2018 Elsevier Patient Education  2022 [employer].

## 2021-03-01 ENCOUNTER — Encounter: Payer: Self-pay | Admitting: Family Medicine

## 2021-03-01 ENCOUNTER — Ambulatory Visit (INDEPENDENT_AMBULATORY_CARE_PROVIDER_SITE_OTHER): Payer: Medicare PPO | Admitting: Family Medicine

## 2021-03-01 ENCOUNTER — Other Ambulatory Visit: Payer: Self-pay

## 2021-03-01 VITALS — BP 126/68 | HR 60 | Temp 98.2°F | Resp 16 | Ht 65.0 in | Wt 141.0 lb

## 2021-03-01 DIAGNOSIS — T148XXA Other injury of unspecified body region, initial encounter: Secondary | ICD-10-CM | POA: Diagnosis not present

## 2021-03-01 DIAGNOSIS — Z23 Encounter for immunization: Secondary | ICD-10-CM | POA: Diagnosis not present

## 2021-03-01 DIAGNOSIS — Z0000 Encounter for general adult medical examination without abnormal findings: Secondary | ICD-10-CM

## 2021-03-01 MED ORDER — SHINGRIX 50 MCG/0.5ML IM SUSR: 0.5000 mL | Freq: Once | INTRAMUSCULAR | 1 refills | Status: AC

## 2021-03-22 ENCOUNTER — Other Ambulatory Visit: Payer: Self-pay | Admitting: Family Medicine

## 2021-03-22 DIAGNOSIS — M545 Low back pain, unspecified: Secondary | ICD-10-CM

## 2021-03-29 ENCOUNTER — Ambulatory Visit: Payer: Self-pay | Admitting: *Deleted

## 2021-03-29 NOTE — Telephone Encounter (Signed)
Appt sch'd with Dr Carlynn Purl for 9.8.2022 @ 11:20

## 2021-03-29 NOTE — Progress Notes (Signed)
Name: Yvonne Andrews   MRN: [id number]    DOB: [birth year]   Date:03/30/2021       Progress Note  Subjective  Chief Complaint  Abdominal Pain  HPI  Lower abdominal pain: symptoms started on Sunday, she states initially pain was peri--umbilical described as cramping like , lasting about 1 minute and come in waves throughout the day . She did not developed nausea, vomiting or fever. She has noticed mild decrease in appetite and yesterday pain was on LLQ for a period of time and since she has a history of diverticulitis she decided to contact us for evaluation. She states today she is feeling better that yesterday. No dysuria, no back pain.    Patient Active Problem List   Diagnosis Date Noted   Unspecified glaucoma 06/24/2017   Diverticulosis 03/08/2017   Vitamin D deficiency 05/29/2016   Intraocular pressure increase 07/04/2015   Bilateral tinnitus 07/04/2015   Benign essential HTN 12/31/2014   Carpal tunnel syndrome 12/31/2014   Dyslipidemia 12/31/2014   Ovarian failure 12/31/2014   Osteopenia 12/31/2014   Allergic rhinitis 12/31/2014   Arthritis of knee, degenerative 12/31/2014   Acquired trigger finger 12/31/2014   Cataracts, bilateral 12/31/2014   Fasting hyperglycemia 12/31/2014   LBP (low back pain) 05/27/2007    Past Surgical History:  Procedure Laterality Date   TRIGGER FINGER RELEASE     TUBAL LIGATION      Family History  Problem Relation Age of Onset   Heart disease Mother    CAD Father    Heart attack Father 64   Cancer Brother    Heart disease Brother    Breast cancer Niece        sisters daughter    Social History   Tobacco Use   Smoking status: Never   Smokeless tobacco: Never   Tobacco comments:    smoking cessation materials not required  Substance Use Topics   Alcohol use: No    Alcohol/week: 0.0 standard drinks     Current Outpatient Medications:    alendronate (FOSAMAX) 70 MG tablet, TAKE 1 TABLET ONE TIME WEEKLY WITH A FULL GLASS OF  WATER ON AN EMPTY STOMACH, Disp: 12 tablet, Rfl: 1   aspirin EC 81 MG tablet, Take 1 tablet (81 mg total) by mouth daily., Disp: 30 tablet, Rfl: 0   Blood Pressure Monitoring (BLOOD PRESSURE KIT) KIT, 1 each by Does not apply route daily., Disp: 1 kit, Rfl: 0   Cholecalciferol (VITAMIN D) 2000 units CAPS, Take 1 capsule (2,000 Units total) by mouth daily., Disp: 30 capsule, Rfl: 0   MULTIPLE VITAMINS-MINERALS ER PO, Take 1 tablet by mouth daily., Disp: , Rfl:    Nutritional Supplements (ESTROVEN PM PO), Take by mouth daily., Disp: , Rfl:    rosuvastatin (CRESTOR) 20 MG tablet, TAKE 1 TABLET EVERY DAY, Disp: 90 tablet, Rfl: 1   timolol (TIMOPTIC) 0.5 % ophthalmic solution, Place 1 drop into both eyes 2 (two) times daily. , Disp: , Rfl:    tiZANidine (ZANAFLEX) 2 MG tablet, TAKE 1 TABLET BY MOUTH EVERYDAY AT BEDTIME, Disp: 90 tablet, Rfl: 0   traZODone (DESYREL) 50 MG tablet, Take 0.5-1 tablets (25-50 mg total) by mouth at bedtime as needed for sleep., Disp: 90 tablet, Rfl: 1   valsartan (DIOVAN) 80 MG tablet, Take 1 tablet (80 mg total) by mouth daily., Disp: 90 tablet, Rfl: 1  Allergies  Allergen Reactions   Lipitor [Atorvastatin] Other (See Comments)    Elevation of LFT's  I personally reviewed active problem list, medication list, allergies, family history, social history, health maintenance with the patient/caregiver today.   ROS  Ten systems reviewed and is negative except as mentioned in HPI   Objective  Vitals:   03/30/21 1109  BP: 130/70  Pulse: 65  Resp: 16  Temp: 98 F (36.7 C)  SpO2: 99%  Weight: 144 lb (65.3 kg)  Height: _0  (1.626 m)    Body mass index is 24.72 kg/m.  Physical Exam  Constitutional: Patient appears well-developed and well-nourished.  No distress.  HEENT: head atraumatic, normocephalic, pupils equal and reactive to light, neck supple Cardiovascular: Normal rate, regular rhythm and normal heart sounds.  No murmur heard. No BLE  edema. Pulmonary/Chest: Effort normal and breath sounds normal. No respiratory distress. Abdominal: Soft.  There is supra pubic and LLQ tenderness, increase in bowel sounds, no some voluntary guarding but no rebound tenderness . Psychiatric: Patient has a normal mood and affect. behavior is normal. Judgment and thought content normal.   Recent Results (from the past 2160 hour(s))  COMPLETE METABOLIC PANEL WITH GFR     Status: Abnormal   Collection Time: 02/01/21  8:34 AM  Result Value Ref Range   Glucose, Bld 100 (H) 65 - 99 mg/dL    Comment: .            Fasting reference interval . For someone without known diabetes, a glucose value between 100 and 125 mg/dL is consistent with prediabetes and should be confirmed with a follow-up test. .    BUN 11 7 - 25 mg/dL   Creat 0.93 0.60 - 1.00 mg/dL   eGFR 63 > OR = 60 mL/min/1.21m    Comment: The eGFR is based on the CKD-EPI 2021 equation. To calculate  the new eGFR from a previous Creatinine or Cystatin C result, go to https://www.kidney.org/professionals/ kdoqi/gfr%5Fcalculator    BUN/Creatinine Ratio NOT APPLICABLE 6 - 22 (calc)   Sodium 142 135 - 146 mmol/L   Potassium 5.2 3.5 - 5.3 mmol/L   Chloride 108 98 - 110 mmol/L   CO2 28 20 - 32 mmol/L   Calcium 9.6 8.6 - 10.4 mg/dL   Total Protein 6.6 6.1 - 8.1 g/dL   Albumin 4.5 3.6 - 5.1 g/dL   Globulin 2.1 1.9 - 3.7 g/dL (calc)   AG Ratio 2.1 1.0 - 2.5 (calc)   Total Bilirubin 0.5 0.2 - 1.2 mg/dL   Alkaline phosphatase (APISO) 51 37 - 153 U/L   AST 20 10 - 35 U/L   ALT 13 6 - 29 U/L  CBC with Differential/Platelet     Status: None   Collection Time: 02/01/21  8:34 AM  Result Value Ref Range   WBC 5.3 3.8 - 10.8 Thousand/uL   RBC 4.16 3.80 - 5.10 Million/uL   Hemoglobin 12.6 11.7 - 15.5 g/dL   HCT 38.9 35.0 - 45.0 %   MCV 93.5 80.0 - 100.0 fL   MCH 30.3 27.0 - 33.0 pg   MCHC 32.4 32.0 - 36.0 g/dL   RDW 13.2 11.0 - 15.0 %   Platelets 196 140 - 400 Thousand/uL   MPV 9.6 7.5  - 12.5 fL   Neutro Abs 2,836 1,500 - 7,800 cells/uL   Lymphs Abs 1,654 850 - 3,900 cells/uL   Absolute Monocytes 445 200 - 950 cells/uL   Eosinophils Absolute 313 15 - 500 cells/uL   Basophils Absolute 53 0 - 200 cells/uL   Neutrophils Relative % 53.5 %  Total Lymphocyte 31.2 %   Monocytes Relative 8.4 %   Eosinophils Relative 5.9 %   Basophils Relative 1.0 %  Lipid panel     Status: None   Collection Time: 02/01/21  8:34 AM  Result Value Ref Range   Cholesterol 171 <200 mg/dL   HDL 68 > OR = 50 mg/dL   Triglycerides 85 <150 mg/dL   LDL Cholesterol (Calc) 85 mg/dL (calc)    Comment: Reference range: <100 . Desirable range <100 mg/dL for primary prevention;   <70 mg/dL for patients with CHD or diabetic patients  with > or = 2 CHD risk factors. Marland Kitchen LDL-C is now calculated using the Martin-Hopkins  calculation, which is a validated novel method providing  better accuracy than the Friedewald equation in the  estimation of LDL-C.  Cresenciano Genre et al. Annamaria Helling. 7989;211(94): 2061-2068  (http://education.[facility].com/faq/FAQ164)    Total CHOL/HDL Ratio 2.5 <5.0 (calc)   Non-HDL Cholesterol (Calc) 103 <130 mg/dL (calc)    Comment: For patients with diabetes plus 1 major ASCVD risk  factor, treating to a non-HDL-C goal of <100 mg/dL  (LDL-C of <70 mg/dL) is considered a therapeutic  option.   Hemoglobin A1c     Status: Abnormal   Collection Time: 02/01/21  8:34 AM  Result Value Ref Range   Hgb A1c MFr Bld 5.9 (H) <5.7 % of total Hgb    Comment: For someone without known diabetes, a hemoglobin  A1c value between 5.7% and 6.4% is consistent with prediabetes and should be confirmed with a  follow-up test. . For someone with known diabetes, a value <7% indicates that their diabetes is well controlled. A1c targets should be individualized based on duration of diabetes, age, comorbid conditions, and other considerations. . This assay result is consistent with an increased  risk of diabetes. . Currently, no consensus exists regarding use of hemoglobin A1c for diagnosis of diabetes for children. .    Mean Plasma Glucose 123 mg/dL   eAG (mmol/L) 6.8 mmol/L      PHQ2/9: Depression screen [facility] 2/9 03/30/2021 03/01/2021 02/01/2021 10/20/2020 07/28/2020  Decreased Interest 0 0 0 0 0  Down, Depressed, Hopeless 0 0 0 0 0  PHQ - 2 Score 0 0 0 0 0  Altered sleeping - - - - -  Tired, decreased energy - - - - -  Change in appetite - - - - -  Feeling bad or failure about yourself  - - - - -  Trouble concentrating - - - - -  Moving slowly or fidgety/restless - - - - -  Suicidal thoughts - - - - -  PHQ-9 Score - - - - -  Difficult doing work/chores - - - - -  Some recent data might be hidden    phq 9 is negative   Fall Risk: Fall Risk  03/30/2021 03/01/2021 02/01/2021 10/20/2020 07/28/2020  Falls in the past year? 0 0 0 0 0  Number falls in past yr: 0 0 0 0 0  Injury with Fall? 0 0 0 0 0  Risk for fall due to : No Fall Risks - - No Fall Risks -  Follow up Falls prevention discussed - - Falls prevention discussed Falls evaluation completed      Functional Status Survey: Is the patient deaf or have difficulty hearing?: No Does the patient have difficulty seeing, even when wearing glasses/contacts?: No Does the patient have difficulty concentrating, remembering, or making decisions?: No Does the patient have difficulty walking or  climbing stairs?: No Does the patient have difficulty dressing or bathing?: No Does the patient have difficulty doing errands alone such as visiting a doctor's office or shopping?: No    Assessment & Plan  1. Diverticulitis  Discussed trying a bland/liquid diet and monitor for worsening of symptoms , we will get labs and give antibiotics to take if symptoms do not improve, discussed importance of calling us back if increase in pain, new onset of fever, nausea, vomiting, inability to keep fluids down   - CBC with  Differential/Platelet - BASIC METABOLIC PANEL WITH GFR - amoxicillin-clavulanate (AUGMENTIN) 875-125 MG tablet; Take 1 tablet by mouth 2 (two) times daily.  Dispense: 20 tablet; Refill: 0

## 2021-03-29 NOTE — Telephone Encounter (Signed)
Patient experiencing lower abdominal pain on and on for several days. Patient denied next available appointment with PCP which is 04/20/2021, patient would like to speak with the nurse first.  Reason for Disposition  [1] MILD pain (e.g., does not interfere with normal activities) AND [2] pain comes and goes (cramps) AND [3] present > 48 hours  (Exception: this same abdominal pain is a chronic symptom recurrent or ongoing AND present > 4 weeks)  Answer Assessment - Initial Assessment Questions 1. LOCATION: "Where does it hurt?"      Lower abdomen, middle 2. RADIATION: "Does the pain shoot anywhere else?" (e.g., chest, back)     No 3. ONSET: "When did the pain begin?" (e.g., minutes, hours or days ago)      2-3 days, off and on 4. SUDDEN: "Gradual or sudden onset?"     gradual 5. PATTERN "Does the pain come and go, or is it constant?"    - If constant: "Is it getting better, staying the same, or worsening?"      (Note: Constant means the pain never goes away completely; most serious pain is constant and it progresses)     - If intermittent: "How long does it last?" "Do you have pain now?"     (Note: Intermittent means the pain goes away completely between bouts)     Comes and goes, occurs daily 6. SEVERITY: "How bad is the pain?"  (e.g., Scale 1-10; mild, moderate, or severe)   - MILD (1-3): doesn't interfere with normal activities, abdomen soft and not tender to touch    - MODERATE (4-7): interferes with normal activities or awakens from sleep, abdomen tender to touch    - SEVERE (8-10): excruciating pain, doubled over, unable to do any normal activities      3/10 7. RECURRENT SYMPTOM: "Have you ever had this type of stomach pain before?" If Yes, ask: "When was the last time?" and "What happened that time?"      yes 8. CAUSE: "What do you think is causing the stomach pain?"     Unsure 9. RELIEVING/AGGRAVATING FACTORS: "What makes it better or worse?" (e.g., movement, antacids, bowel  movement)     Tylenol helps 10. OTHER SYMPTOMS: "Do you have any other symptoms?" (e.g., back pain, diarrhea, fever, urination pain, vomiting)      no  Protocols used: Abdominal Pain - Female-A-AH

## 2021-03-29 NOTE — Telephone Encounter (Signed)
Pt reports lower mid abdominal pain, onset 2-3 days ago. Intermittent, duration of 1 minute when occurs, rates at 3/10. States H/O diverticulosis, "But it was always left side when I had pain. This may be gas." Denies any change in stools, no diarrhea, no constipation. Pain does not radiate. States "Tylenol helps." Denies any dysuria, urinating as usual. Pt declines appt at this time. Requesting "Medicine Dr. Carlynn Purl called in for me when I had diverticulosis."  Advised may need OV, assured NT would route to practice for PCPs review and final disposition.  Please advise: [phone]

## 2021-03-30 ENCOUNTER — Encounter: Payer: Self-pay | Admitting: Family Medicine

## 2021-03-30 ENCOUNTER — Other Ambulatory Visit: Payer: Self-pay

## 2021-03-30 ENCOUNTER — Ambulatory Visit: Payer: Medicare PPO | Admitting: Family Medicine

## 2021-03-30 VITALS — BP 130/70 | HR 65 | Temp 98.0°F | Resp 16 | Ht 64.0 in | Wt 144.0 lb

## 2021-03-30 DIAGNOSIS — K5792 Diverticulitis of intestine, part unspecified, without perforation or abscess without bleeding: Secondary | ICD-10-CM | POA: Diagnosis not present

## 2021-03-30 MED ORDER — AMOXICILLIN-POT CLAVULANATE 875-125 MG PO TABS: 1.0000 | ORAL_TABLET | Freq: Two times a day (BID) | ORAL | 0 refills | Status: DC

## 2021-03-31 ENCOUNTER — Telehealth: Payer: Self-pay

## 2021-03-31 DIAGNOSIS — K5792 Diverticulitis of intestine, part unspecified, without perforation or abscess without bleeding: Secondary | ICD-10-CM

## 2021-03-31 LAB — CBC WITH DIFFERENTIAL/PLATELET
Absolute Monocytes: 660 cells/uL (ref 200–950)
Basophils Absolute: 68 cells/uL (ref 0–200)
Basophils Relative: 0.9 %
Eosinophils Absolute: 233 cells/uL (ref 15–500)
Eosinophils Relative: 3.1 %
HCT: 40.4 % (ref 35.0–45.0)
Hemoglobin: 13 g/dL (ref 11.7–15.5)
Lymphs Abs: 2558 cells/uL (ref 850–3900)
MCH: 29.9 pg (ref 27.0–33.0)
MCHC: 32.2 g/dL (ref 32.0–36.0)
MCV: 92.9 fL (ref 80.0–100.0)
MPV: 10.3 fL (ref 7.5–12.5)
Monocytes Relative: 8.8 %
Neutro Abs: 3983 cells/uL (ref 1500–7800)
Neutrophils Relative %: 53.1 %
Platelets: 213 10*3/uL (ref 140–400)
RBC: 4.35 10*6/uL (ref 3.80–5.10)
RDW: 12.9 % (ref 11.0–15.0)
Total Lymphocyte: 34.1 %
WBC: 7.5 10*3/uL (ref 3.8–10.8)

## 2021-03-31 LAB — BASIC METABOLIC PANEL WITH GFR
BUN: 11 mg/dL (ref 7–25)
CO2: 29 mmol/L (ref 20–32)
Calcium: 10.4 mg/dL (ref 8.6–10.4)
Chloride: 105 mmol/L (ref 98–110)
Creat: 0.86 mg/dL (ref 0.60–1.00)
Glucose, Bld: 84 mg/dL (ref 65–99)
Potassium: 5.5 mmol/L — ABNORMAL HIGH (ref 3.5–5.3)
Sodium: 142 mmol/L (ref 135–146)
eGFR: 69 mL/min/{1.73_m2} (ref >=60)

## 2021-03-31 MED ORDER — AMOXICILLIN-POT CLAVULANATE 875-125 MG PO TABS: 1.0000 | ORAL_TABLET | Freq: Two times a day (BID) | ORAL | 0 refills | Status: DC

## 2021-03-31 NOTE — Telephone Encounter (Signed)
Copied from CRM [id number]. Topic: General - Other >> Mar 31, 2021  7:59 AM Traci Sermon wrote: Reason for CRM: Pt called in stating when she went to the pharmacy to pick up her amoxicillin-clavulanate (AUGMENTIN) 875-125 MG tablet  medication, they told her that is was a back stock and was not able to get it. Pt requested if PCP could send over a different medication. Please advise

## 2021-03-31 NOTE — Telephone Encounter (Signed)
Copied from CRM [id number]. Topic: General - Other >> Mar 30, 2021  4:24 PM Gaetana Michaelis A wrote: Reason for CRM: Patient's preferred pharmacy has notified them that the pharmacy is currently out of stock of amoxicillin-clavulanate (AUGMENTIN) 875-125 MG tablet [[id number]]   The patient was directed to contact their PCP and request for an alternative prescription to be written if possible   Please contact the patient as well as their preferred pharmacy further when possible

## 2021-03-31 NOTE — Addendum Note (Signed)
Addended by: Davene Costain on: 03/31/2021 04:43 PM   Modules accepted: Orders

## 2021-04-06 ENCOUNTER — Ambulatory Visit: Payer: Medicare PPO | Admitting: Family Medicine

## 2021-05-03 ENCOUNTER — Ambulatory Visit: Payer: Self-pay | Admitting: *Deleted

## 2021-05-03 NOTE — Telephone Encounter (Signed)
Pt reports BP trending up.  Last week 175/95, today 162/96  HR 65.  No missed doses of meds, denies headache, no dizziness, no associated symptoms.  States has always watched her diet, limits salt, is active. States she read that "Women of color may need an additional med if on Valsartin."  Offered appt, questioning if she can come in for BP check "To see if my monitor is accurate and go from there." Assured pt NT would route to practice fr PCPs review and final disposition. Please advise:[phone]    Reason for Disposition  Systolic BP  >= 160 OR Diastolic >= 100  Answer Assessment - Initial Assessment Questions 1. BLOOD PRESSURE: "What is the blood pressure?" "Did you take at least two measurements 5 minutes apart?"     162/96 today.  Last week 175/95  2. ONSET: "When did you take your blood pressure?"     today 3. HOW: "How did you obtain the blood pressure?" (e.g., visiting nurse, automatic home BP monitor)     Home monitor, arm, new 4. HISTORY: "Do you have a history of high blood pressure?"     Yes 5. MEDICATIONS: "Are you taking any medications for blood pressure?" "Have you missed any doses recently?"     No 6. OTHER SYMPTOMS: "Do you have any symptoms?" (e.g., headache, chest pain, blurred vision, difficulty breathing, weakness)     None  Protocols used: Blood Pressure - High-A-AH

## 2021-05-04 NOTE — Telephone Encounter (Signed)
We have a staff meeting today and pt is out of town next week other than the day that she is scheduled .

## 2021-05-04 NOTE — Telephone Encounter (Signed)
Also pt cant come in until after 2

## 2021-05-04 NOTE — Telephone Encounter (Signed)
You do not have anything available for this week or next. Pt is scheduled with Della Goo FNP on Tuesday 05/09/21

## 2021-05-09 ENCOUNTER — Ambulatory Visit: Payer: Medicare PPO | Admitting: Nurse Practitioner

## 2021-05-09 ENCOUNTER — Other Ambulatory Visit: Payer: Self-pay

## 2021-05-09 VITALS — BP 158/80 | HR 76 | Temp 98.1°F | Resp 14 | Ht 64.0 in | Wt 147.1 lb

## 2021-05-09 DIAGNOSIS — Z8719 Personal history of other diseases of the digestive system: Secondary | ICD-10-CM

## 2021-05-09 DIAGNOSIS — E875 Hyperkalemia: Secondary | ICD-10-CM

## 2021-05-09 DIAGNOSIS — I10 Essential (primary) hypertension: Secondary | ICD-10-CM | POA: Diagnosis not present

## 2021-05-09 DIAGNOSIS — Z23 Encounter for immunization: Secondary | ICD-10-CM

## 2021-05-09 MED ORDER — HYDROCHLOROTHIAZIDE 12.5 MG PO TABS
12.5000 mg | ORAL_TABLET | Freq: Every day | ORAL | 0 refills | Status: DC
Start: 2021-05-09 — End: 2021-05-31

## 2021-05-09 NOTE — Progress Notes (Signed)
 BP (!) 162/78   Pulse 76   Temp 98.1 F (36.7 C) (Oral)   Resp 14   Ht 5' 4" (1.626 m)   Wt 147 lb 1.6 oz (66.7 kg)   SpO2 99%   BMI 25.25 kg/m    Subjective:    Patient ID: Yvonne Andrews, female    DOB: 03/07/1941, 80 y.o.   MRN: [id number]  HPI: Yvonne Andrews is a 80 y.o. female, here alone  Chief Complaint  Patient presents with   Hypertension    Discuss blood pressure changes   HTN: She says that her blood pressure has been going up the last couple of weeks.  She denies any chest pain, headaches, dizziness or shortness of breath.  She has been checking her blood pressure consistently and it has been running around 174-138/ 98-77. She is currently taking valsartan 80 mg daily. She used to be on HCTZ 12.5 mg and was stopped due to blood pressure at goal.  Her blood pressure today in office was 162/78 recheck was 158/80.  Will restart HCTZ, she is going to continue to monitor her blood pressure and will be back in one week for blood pressure check.   Elevated potassium:  She had an elevated potassium of 5.5 on a blood draw on 03/30/21.  She denies any chest pain or palpitations. Will recheck today.  Diverticulitis:  She was seen and treated on 03/30/21 by Dr Sowles, for diverticulitis.  She says she is feeling much better. She completed the antibiotics with no issues.   Relevant past medical, surgical, family and social history reviewed and updated as indicated. Interim medical history since our last visit reviewed. Allergies and medications reviewed and updated.  Review of Systems  Constitutional: Negative for fever or weight change.  Respiratory: Negative for cough and shortness of breath.   Cardiovascular: Negative for chest pain or palpitations.  Gastrointestinal: Negative for abdominal pain, no bowel changes.  Musculoskeletal: Negative for gait problem or joint swelling.  Skin: Negative for rash.  Neurological: Negative for dizziness or headache.  No other specific  complaints in a complete review of systems (except as listed in HPI above).      Objective:    BP (!) 162/78   Pulse 76   Temp 98.1 F (36.7 C) (Oral)   Resp 14   Ht 5' 4" (1.626 m)   Wt 147 lb 1.6 oz (66.7 kg)   SpO2 99%   BMI 25.25 kg/m   Wt Readings from Last 3 Encounters:  05/09/21 147 lb 1.6 oz (66.7 kg)  03/30/21 144 lb (65.3 kg)  03/01/21 141 lb (64 kg)    Physical Exam  Constitutional: Patient appears well-developed and well-nourished. No distress.  HEENT: head atraumatic, normocephalic, pupils equal and reactive to light, neck supple Cardiovascular: Normal rate, regular rhythm and normal heart sounds.  No murmur heard. No BLE edema. Pulmonary/Chest: Effort normal and breath sounds normal. No respiratory distress. Abdominal: Soft.  There is no tenderness. Psychiatric: Patient has a normal mood and affect. behavior is normal. Judgment and thought content normal.   Results for orders placed or performed in visit on 03/30/21  CBC with Differential/Platelet  Result Value Ref Range   WBC 7.5 3.8 - 10.8 Thousand/uL   RBC 4.35 3.80 - 5.10 Million/uL   Hemoglobin 13.0 11.7 - 15.5 g/dL   HCT 40.4 35.0 - 45.0 %   MCV 92.9 80.0 - 100.0 fL   MCH 29.9 27.0 - 33.0 pg     MCHC 32.2 32.0 - 36.0 g/dL   RDW 12.9 11.0 - 15.0 %   Platelets 213 140 - 400 Thousand/uL   MPV 10.3 7.5 - 12.5 fL   Neutro Abs 3,983 1,500 - 7,800 cells/uL   Lymphs Abs 2,558 850 - 3,900 cells/uL   Absolute Monocytes 660 200 - 950 cells/uL   Eosinophils Absolute 233 15 - 500 cells/uL   Basophils Absolute 68 0 - 200 cells/uL   Neutrophils Relative % 53.1 %   Total Lymphocyte 34.1 %   Monocytes Relative 8.8 %   Eosinophils Relative 3.1 %   Basophils Relative 0.9 %  BASIC METABOLIC PANEL WITH GFR  Result Value Ref Range   Glucose, Bld 84 65 - 99 mg/dL   BUN 11 7 - 25 mg/dL   Creat 0.86 0.60 - 1.00 mg/dL   eGFR 69 > OR = 60 mL/min/1.73m2   BUN/Creatinine Ratio NOT APPLICABLE 6 - 22 (calc)   Sodium 142  135 - 146 mmol/L   Potassium 5.5 (H) 3.5 - 5.3 mmol/L   Chloride 105 98 - 110 mmol/L   CO2 29 20 - 32 mmol/L   Calcium 10.4 8.6 - 10.4 mg/dL      Assessment & Plan:   1. Benign essential HTN -monitor blood pressure -come back in one week for blood pressure check - hydrochlorothiazide (HYDRODIURIL) 12.5 MG tablet; Take 1 tablet (12.5 mg total) by mouth daily.  Dispense: 30 tablet; Refill: 0  2. Serum potassium elevated  - Basic Metabolic Panel (BMET)    3. History of diverticulitis  -feeling much better, no concerns at this time   4. Need for influenza vaccination  - Flu Vaccine QUAD High Dose(Fluad)   Follow up plan: Return for 1 week blood pressure check.       

## 2021-05-10 LAB — BASIC METABOLIC PANEL
BUN: 11 mg/dL (ref 7–25)
CO2: 27 mmol/L (ref 20–32)
Calcium: 10 mg/dL (ref 8.6–10.4)
Chloride: 107 mmol/L (ref 98–110)
Creat: 0.89 mg/dL (ref 0.60–1.00)
Glucose, Bld: 105 mg/dL (ref 65–139)
Potassium: 4 mmol/L (ref 3.5–5.3)
Sodium: 142 mmol/L (ref 135–146)

## 2021-05-12 ENCOUNTER — Ambulatory Visit: Payer: Self-pay

## 2021-05-12 DIAGNOSIS — I10 Essential (primary) hypertension: Secondary | ICD-10-CM

## 2021-05-12 NOTE — Chronic Care Management (AMB) (Addendum)
  Care Management      Name: Yvonne Andrews MRN: [id number] DOB: 09/14/1940   Primary Care Provider : Alba Cory, MD Reason for referral : Chronic Care Management (Care Coordination)  [name] contacted the care management team for assistance with appointment/vaccination scheduling. Appointment scheduled for 05/16/21.    Follow Up Plan:  [name] will contact the care management team if additional assistance is required.   [facility] [phone]

## 2021-05-17 ENCOUNTER — Ambulatory Visit: Payer: Medicare PPO | Admitting: Nurse Practitioner

## 2021-05-17 ENCOUNTER — Encounter: Payer: Self-pay | Admitting: Nurse Practitioner

## 2021-05-17 ENCOUNTER — Other Ambulatory Visit: Payer: Self-pay

## 2021-05-17 VITALS — BP 132/78 | HR 79 | Temp 98.4°F | Resp 16 | Ht 64.0 in | Wt 145.4 lb

## 2021-05-17 DIAGNOSIS — I10 Essential (primary) hypertension: Secondary | ICD-10-CM

## 2021-05-17 NOTE — Progress Notes (Signed)
   BP 132/78   Pulse 79   Temp 98.4 F (36.9 C) (Oral)   Resp 16   Ht 5\' 4"  (1.626 m)   Wt 145 lb 6.4 oz (66 kg)   SpO2 98%   BMI 24.96 kg/m    Subjective:    Patient ID: , female    DOB: 06-16-1941, 80 y.o.   MRN: [id number]  HPI: Yvonne Andrews is a 80 y.o. female, here alone  Chief Complaint  Patient presents with   Hypertension    1 week follow up   HTN follow-up:  She was seen on 05/09/21 for concerns with her blood pressure.  Her blood pressure was running 174-138/98-77.  She was only taking valsartan 80 mg daily. She started  hydrochlorothiazide 12.5 mg daily.  Her blood pressure has improved to 135-120 /77-71.  She denies any dizziness, chest pain, or shortness of breath.  She will continue to monitor blood pressure.   Relevant past medical, surgical, family and social history reviewed and updated as indicated. Interim medical history since our last visit reviewed. Allergies and medications reviewed and updated.  Review of Systems  Constitutional: Negative for fever or weight change.  Respiratory: Negative for cough and shortness of breath.   Cardiovascular: Negative for chest pain or palpitations.  Gastrointestinal: Negative for abdominal pain, no bowel changes.  Musculoskeletal: Negative for gait problem or joint swelling.  Skin: Negative for rash.  Neurological: Negative for dizziness or headache.  No other specific complaints in a complete review of systems (except as listed in HPI above).      Objective:    BP 132/78   Pulse 79   Temp 98.4 F (36.9 C) (Oral)   Resp 16   Ht 5\' 4"  (1.626 m)   Wt 145 lb 6.4 oz (66 kg)   SpO2 98%   BMI 24.96 kg/m   Wt Readings from Last 3 Encounters:  05/17/21 145 lb 6.4 oz (66 kg)  05/09/21 147 lb 1.6 oz (66.7 kg)  03/30/21 144 lb (65.3 kg)    Physical Exam  Constitutional: Patient appears well-developed and well-nourished. No distress.  HEENT: head atraumatic, normocephalic, pupils equal and reactive  to light, neck supple Cardiovascular: Normal rate, regular rhythm and normal heart sounds.  No murmur heard. No BLE edema. Pulmonary/Chest: Effort normal and breath sounds normal. No respiratory distress. Abdominal: Soft.  There is no tenderness. Psychiatric: Patient has a normal mood and affect. behavior is normal. Judgment and thought content normal.   Results for orders placed or performed in visit on 05/09/21  Basic Metabolic Panel (BMET)  Result Value Ref Range   Glucose, Bld 105 65 - 139 mg/dL   BUN 11 7 - 25 mg/dL   Creat 05/30/21 05/11/21 - 9.73 mg/dL   BUN/Creatinine Ratio NOT APPLICABLE 6 - 22 (calc)   Sodium 142 135 - 146 mmol/L   Potassium 4.0 3.5 - 5.3 mmol/L   Chloride 107 98 - 110 mmol/L   CO2 27 20 - 32 mmol/L   Calcium 10.0 8.6 - 10.4 mg/dL      Assessment & Plan:   1. Benign essential HTN -continue taking valsartan 80 mg daily -continue taking hydrochlorothiazide 12.5 mg daily -continue tracking blood pressure   Follow up plan: Return if symptoms worsen or fail to improve.

## 2021-05-31 ENCOUNTER — Other Ambulatory Visit: Payer: Self-pay | Admitting: Nurse Practitioner

## 2021-05-31 DIAGNOSIS — I10 Essential (primary) hypertension: Secondary | ICD-10-CM

## 2021-05-31 NOTE — Telephone Encounter (Signed)
Requested Prescriptions  Pending Prescriptions Disp Refills  . hydrochlorothiazide (HYDRODIURIL) 12.5 MG tablet [Pharmacy Med Name: HYDROCHLOROTHIAZIDE 12.5 MG TB] 90 tablet 1    Sig: TAKE 1 TABLET BY MOUTH EVERY DAY     Cardiovascular: Diuretics - Thiazide Passed - 05/31/2021 10:32 AM      Passed - Ca in normal range and within 360 days    Calcium  Date Value Ref Range Status  05/09/2021 10.0 8.6 - 10.4 mg/dL Final         Passed - Cr in normal range and within 360 days    Creat  Date Value Ref Range Status  05/09/2021 0.89 0.60 - 1.00 mg/dL Final         Passed - K in normal range and within 360 days    Potassium  Date Value Ref Range Status  05/09/2021 4.0 3.5 - 5.3 mmol/L Final         Passed - Na in normal range and within 360 days    Sodium  Date Value Ref Range Status  05/09/2021 142 135 - 146 mmol/L Final         Passed - Last BP in normal range    BP Readings from Last 1 Encounters:  05/17/21 132/78         Passed - Valid encounter within last 6 months    Recent Outpatient Visits          2 weeks ago Benign essential HTN   [facility] Berniece Salines, FNP   3 weeks ago Benign essential HTN   [facility] Berniece Salines, FNP   2 months ago Diverticulitis   [facility] Alba Cory, MD   3 months ago Well adult exam   [facility] Alba Cory, MD   3 months ago Dyslipidemia   [facility] Alba Cory, MD      Future Appointments            In 2 months Carlynn Purl, Danna Hefty, MD [facility], [facility]   In 4 months  [facility], [facility]   In 9 months Alba Cory, MD [facility], [facility]

## 2021-06-08 ENCOUNTER — Ambulatory Visit: Payer: Self-pay | Admitting: *Deleted

## 2021-06-08 NOTE — Telephone Encounter (Signed)
Per agent: "Pt would like to discuss if she is supposed to continue hydrochlorothiazide (HYDRODIURIL) 12.5 MG tablet?  She saw Raynelle Fanning at last visit who prescribed this, and she was not told if she should continue.  ( A refill was sent in 11/09 for 90 w/ 1 refill)"  Pt asking if she should be taking the HCTZ 12.5mg .  Advised per provider note 05/17/21 that is a current med. States "I was wondering because the pharmacy didn't call and tell me the refill was ready. Advised refilled 05/31/21 and receipt confirmed by pharmacy. States she will check. Advised to have pharmacy CB if needed to resend. Pt verbalizes understanding.     Reason for Disposition  Caller has medicine question only, adult not sick, AND triager answers question  Answer Assessment - Initial Assessment Questions 1. NAME of MEDICATION: "What medicine are you calling about?"     HCTZ 2. QUESTION: "What is your question?" (e.g., double dose of medicine, side effect)     "Should I keep taking it?" 3. PRESCRIBING HCP: "Who prescribed it?" Reason: if prescribed by specialist, call should be referred to that group.     *No Answer* 4. SYMPTOMS: "Do you have any symptoms?"     *No Answer* 5. SEVERITY: If symptoms are present, ask "Are they mild, moderate or severe?"     *No Answer* 6. PREGNANCY:  "Is there any chance that you are pregnant?" "When was your last menstrual period?"     *No Answer*  Protocols used: Medication Question Call-A-AH

## 2021-06-28 ENCOUNTER — Other Ambulatory Visit: Payer: Self-pay | Admitting: Family Medicine

## 2021-06-28 DIAGNOSIS — M8580 Other specified disorders of bone density and structure, unspecified site: Secondary | ICD-10-CM

## 2021-06-28 DIAGNOSIS — H35342 Macular cyst, hole, or pseudohole, left eye: Secondary | ICD-10-CM | POA: Diagnosis not present

## 2021-06-28 DIAGNOSIS — Z0100 Encounter for examination of eyes and vision without abnormal findings: Secondary | ICD-10-CM | POA: Diagnosis not present

## 2021-06-28 DIAGNOSIS — H40053 Ocular hypertension, bilateral: Secondary | ICD-10-CM | POA: Diagnosis not present

## 2021-07-03 ENCOUNTER — Other Ambulatory Visit: Payer: Self-pay | Admitting: Family Medicine

## 2021-07-03 DIAGNOSIS — I10 Essential (primary) hypertension: Secondary | ICD-10-CM

## 2021-07-22 ENCOUNTER — Other Ambulatory Visit: Payer: Self-pay | Admitting: Family Medicine

## 2021-07-22 DIAGNOSIS — M545 Low back pain, unspecified: Secondary | ICD-10-CM

## 2021-07-22 NOTE — Telephone Encounter (Signed)
Requested medication (s) are due for refill today: yes  Requested medication (s) are on the active medication list: yes  Last refill:  04/26/21  Future visit scheduled: 08/04/21  Notes to clinic:  This medication can not be delegated, please assess.   Requested Prescriptions  Pending Prescriptions Disp Refills   tiZANidine (ZANAFLEX) 2 MG tablet [Pharmacy Med Name: TIZANIDINE HCL 2 MG TABLET] 90 tablet 0    Sig: TAKE 1 TABLET BY MOUTH EVERYDAY AT BEDTIME     Not Delegated - Cardiovascular:  Alpha-2 Agonists - tizanidine Failed - 07/22/2021  2:23 AM      Failed - This refill cannot be delegated      Passed - Valid encounter within last 6 months    Recent Outpatient Visits           2 months ago Benign essential HTN   [facility] Berniece Salines, FNP   2 months ago Benign essential HTN   [facility] Berniece Salines, FNP   3 months ago Diverticulitis   [facility] Alba Cory, MD   4 months ago Well adult exam   [facility] Alba Cory, MD   5 months ago Dyslipidemia   [facility] Alba Cory, MD       Future Appointments             In 1 week Alba Cory, MD [facility], [facility]   In 3 months  [facility], [facility]   In 7 months Alba Cory, MD [facility], [facility]

## 2021-08-03 NOTE — Progress Notes (Signed)
Name: Yvonne Andrews   MRN: [id number]    DOB: 15-Jul-1941   Date:08/04/2021       Progress Note  Subjective  Chief Complaint  Follow Up  HPI  HTN: Patient has been compliant with medications - valsartan and hctz , she states sometimes bp is low in am's and she takes only valsartan and waits until later to take HCTZ. She denies side effects, no chest pain, no palpitations, headaches, dizziness , she has intermittent  ankle edema.  She was referred to  Dr. Okey DupreEnd - cardiologist in May 2018 for bradycardia, but had repeat EKG and was given reassurance. BP has been well controlled lately, no longer spiking like it used to    Hyperlipidemia: Lipitor caused LFT"s to go up, Pravastatin was not strong enough, taking Rosuvastatin and denies  side effects. Last labs reviewed with patient and we will recheck this Summer   Left lower back pain : she states she has intermittent pain on left lower back for years, but lately almost daily, described as aching/soreness when she moves around.  No bowel or bladder incontinence, no weakness and does not affects her gait Does not radiate down her leg .She takes Tizanidine prn and symptoms are stable. She also takes Tylenol prn    Prediabetes/fasting hyperglycemia:  hgbA1C has been between 5.8 % to 6.1% last Summer it was 5.9 %   She denies polyphagia, polydipsia or polyuria. She still likes sweet tea, drinking about 16 oz per day    OA right knee: she states has pain above right knee when going up or down stairs she states grinding is stable, she usually takes Tylenol prn , very seldom takes Ibuprofen    Vitamin D deficiency: taking otc supplementation, unchanged    Osteopenia: last bone density done 01/2018 and showed mild progression of osteopenia, with FRAX of major fracture at 5% and hip at 1.1%, she is now on Alendronate since July 2019 , last bone density 02/2020 showed improvement We will recheck Summer 2023    Senile purpura: she states she bruises easily on  arms or legs, stable, reassurance given    Patient Active Problem List   Diagnosis Date Noted   Unspecified glaucoma 06/24/2017   Diverticulosis 03/08/2017   Vitamin D deficiency 05/29/2016   Intraocular pressure increase 07/04/2015   Bilateral tinnitus 07/04/2015   Benign essential HTN 12/31/2014   Carpal tunnel syndrome 12/31/2014   Dyslipidemia 12/31/2014   Ovarian failure 12/31/2014   Osteopenia 12/31/2014   Allergic rhinitis 12/31/2014   Arthritis of knee, degenerative 12/31/2014   Acquired trigger finger 12/31/2014   Cataracts, bilateral 12/31/2014   Fasting hyperglycemia 12/31/2014   LBP (low back pain) 05/27/2007    Past Surgical History:  Procedure Laterality Date   TRIGGER FINGER RELEASE     TUBAL LIGATION      Family History  Problem Relation Age of Onset   Heart disease Mother    CAD Father    Heart attack Father [id number]   Cancer Brother    Heart disease Brother    Breast cancer Niece        sisters daughter    Social History   Tobacco Use   Smoking status: Never   Smokeless tobacco: Never   Tobacco comments:    smoking cessation materials not required  Substance Use Topics   Alcohol use: No    Alcohol/week: 0.0 standard drinks     Current Outpatient Medications:    alendronate (FOSAMAX) 70 MG tablet,  TAKE 1 TABLET ONE TIME WEEKLY WITH A FULL GLASS OF WATER ON AN EMPTY STOMACH, Disp: 12 tablet, Rfl: 1   aspirin EC 81 MG tablet, Take 1 tablet (81 mg total) by mouth daily., Disp: 30 tablet, Rfl: 0   Cholecalciferol (VITAMIN D) 2000 units CAPS, Take 1 capsule (2,000 Units total) by mouth daily., Disp: 30 capsule, Rfl: 0   hydrochlorothiazide (HYDRODIURIL) 12.5 MG tablet, TAKE 1 TABLET BY MOUTH EVERY DAY, Disp: 90 tablet, Rfl: 1   MULTIPLE VITAMINS-MINERALS ER PO, Take 1 tablet by mouth daily., Disp: , Rfl:    Nutritional Supplements (ESTROVEN PM PO), Take by mouth daily., Disp: , Rfl:    rosuvastatin (CRESTOR) 20 MG tablet, TAKE 1 TABLET EVERY DAY, Disp:  90 tablet, Rfl: 1   timolol (TIMOPTIC) 0.5 % ophthalmic solution, Place 1 drop into both eyes 2 (two) times daily. , Disp: , Rfl:    tiZANidine (ZANAFLEX) 2 MG tablet, TAKE 1 TABLET BY MOUTH EVERYDAY AT BEDTIME, Disp: 90 tablet, Rfl: 0   traZODone (DESYREL) 50 MG tablet, Take 0.5-1 tablets (25-50 mg total) by mouth at bedtime as needed for sleep., Disp: 90 tablet, Rfl: 1   valsartan (DIOVAN) 80 MG tablet, TAKE 1 TABLET EVERY DAY, Disp: 90 tablet, Rfl: 1  Allergies  Allergen Reactions   Lipitor [Atorvastatin] Other (See Comments)    Elevation of LFT's    I personally reviewed active problem list, medication list, allergies, family history, social history, health maintenance with the patient/caregiver today.   ROS  Constitutional: Negative for fever or weight change.  Respiratory: Negative for cough and shortness of breath.   Cardiovascular: Negative for chest pain or palpitations.  Gastrointestinal: Negative for abdominal pain, no bowel changes.  Musculoskeletal: Negative for gait problem or joint swelling.  Skin: Negative for rash.  Neurological: Negative for dizziness or headache.  No other specific complaints in a complete review of systems (except as listed in HPI above).   Objective  Vitals:   08/04/21 0826  BP: 122/70  Pulse: 74  Resp: 16  Temp: 98.3 F (36.8 C)  SpO2: 99%  Weight: 147 lb (66.7 kg)  Height: 5\' 4"  (1.626 m)    Body mass index is 25.23 kg/m.  Physical Exam  Constitutional: Patient appears well-developed and well-nourished.  No distress.  HEENT: head atraumatic, normocephalic, pupils equal and reactive to light, neck supple, throat within normal limits Cardiovascular: Normal rate, regular rhythm and normal heart sounds.  No murmur heard. No BLE edema. Pulmonary/Chest: Effort normal and breath sounds normal. No respiratory distress. Abdominal: Soft.  There is no tenderness. Psychiatric: Patient has a normal mood and affect. behavior is normal.  Judgment and thought content normal.  Muscular skeletal: pain on popliteal fossa of right knee  with flexion of right knee   Recent Results (from the past 2160 hour(s))  Basic Metabolic Panel (BMET)     Status: None   Collection Time: 05/09/21  2:53 PM  Result Value Ref Range   Glucose, Bld 105 65 - 139 mg/dL    Comment: .        Non-fasting reference interval .    BUN 11 7 - 25 mg/dL   Creat 05/11/21 9.48 - 5.46 mg/dL   BUN/Creatinine Ratio NOT APPLICABLE 6 - 22 (calc)   Sodium 142 135 - 146 mmol/L   Potassium 4.0 3.5 - 5.3 mmol/L   Chloride 107 98 - 110 mmol/L   CO2 27 20 - 32 mmol/L   Calcium 10.0 8.6 -  10.4 mg/dL    ASN0/5: Depression screen [facility] 2/9 08/04/2021 05/17/2021 05/09/2021 03/30/2021 03/01/2021  Decreased Interest 0 0 0 0 0  Down, Depressed, Hopeless 0 0 0 0 0  PHQ - 2 Score 0 0 0 0 0  Altered sleeping 0 - - - -  Tired, decreased energy 0 - - - -  Change in appetite 0 - - - -  Feeling bad or failure about yourself  0 - - - -  Trouble concentrating 0 - - - -  Moving slowly or fidgety/restless 0 - - - -  Suicidal thoughts 0 - - - -  PHQ-9 Score 0 - - - -  Difficult doing work/chores - - - - -  Some recent data might be hidden    phq 9 is negative   Fall Risk: Fall Risk  08/04/2021 05/17/2021 05/09/2021 03/30/2021 03/01/2021  Falls in the past year? 0 0 0 0 0  Number falls in past yr: 0 0 0 0 0  Injury with Fall? 0 0 0 0 0  Risk for fall due to : No Fall Risks - - No Fall Risks -  Follow up Falls prevention discussed Falls evaluation completed Falls evaluation completed Falls prevention discussed -      Functional Status Survey: Is the patient deaf or have difficulty hearing?: No Does the patient have difficulty seeing, even when wearing glasses/contacts?: No Does the patient have difficulty concentrating, remembering, or making decisions?: No Does the patient have difficulty walking or climbing stairs?: No Does the patient have difficulty dressing or bathing?:  No Does the patient have difficulty doing errands alone such as visiting a doctor's office or shopping?: No    Assessment & Plan  1. Benign essential HTN   2. Vitamin D deficiency   3. Senile purpura (HCC)  Reassurance   4. Fasting hyperglycemia   5. Dyslipidemia  - rosuvastatin (CRESTOR) 20 MG tablet; Take 1 tablet (20 mg total) by mouth daily.  Dispense: 90 tablet; Refill: 1  6. Primary osteoarthritis of right knee  Discussed referral to Ortho but she would like to hold off for now   7. Intermittent low back pain  Stable

## 2021-08-04 ENCOUNTER — Encounter: Payer: Self-pay | Admitting: Family Medicine

## 2021-08-04 ENCOUNTER — Ambulatory Visit (INDEPENDENT_AMBULATORY_CARE_PROVIDER_SITE_OTHER): Payer: Medicare PPO | Admitting: Family Medicine

## 2021-08-04 VITALS — BP 122/70 | HR 74 | Temp 98.3°F | Resp 16 | Ht 64.0 in | Wt 147.0 lb

## 2021-08-04 DIAGNOSIS — D692 Other nonthrombocytopenic purpura: Secondary | ICD-10-CM

## 2021-08-04 DIAGNOSIS — M1711 Unilateral primary osteoarthritis, right knee: Secondary | ICD-10-CM | POA: Diagnosis not present

## 2021-08-04 DIAGNOSIS — E785 Hyperlipidemia, unspecified: Secondary | ICD-10-CM

## 2021-08-04 DIAGNOSIS — M545 Low back pain, unspecified: Secondary | ICD-10-CM

## 2021-08-04 DIAGNOSIS — E559 Vitamin D deficiency, unspecified: Secondary | ICD-10-CM

## 2021-08-04 DIAGNOSIS — R7301 Impaired fasting glucose: Secondary | ICD-10-CM | POA: Diagnosis not present

## 2021-08-04 DIAGNOSIS — I10 Essential (primary) hypertension: Secondary | ICD-10-CM

## 2021-08-04 MED ORDER — ROSUVASTATIN CALCIUM 20 MG PO TABS: 20.0000 mg | ORAL_TABLET | Freq: Every day | ORAL | 1 refills | Status: DC

## 2021-10-16 ENCOUNTER — Telehealth: Payer: Self-pay | Admitting: Family Medicine

## 2021-10-16 NOTE — Telephone Encounter (Signed)
Medication Refill - Medication:  ?tiZANidine (ZANAFLEX) 2 MG tablet  ? ?Has the patient contacted their pharmacy? Yes.   ?Contact PCP ? ?Preferred Pharmacy (with phone number or street name):  ?[id number] - [place], [place] - [address]  ?[address] [place], [place] [address]  ?Phone:  [phone]  Fax:  [phone]  ? ?Has the patient been seen for an appointment in the last year OR does the patient have an upcoming appointment? Yes.   ? ?Agent: Please be advised that RX refills may take up to 3 business days. We ask that you follow-up with your pharmacy. ?

## 2021-10-16 NOTE — Telephone Encounter (Signed)
Please review for refill. Refill not delegated per protocol.  ?Last refill:1 3/23 #90 ?Last OV: 08/04/21.  ?

## 2021-10-17 ENCOUNTER — Other Ambulatory Visit: Payer: Self-pay

## 2021-10-17 DIAGNOSIS — M545 Low back pain, unspecified: Secondary | ICD-10-CM

## 2021-10-17 MED ORDER — TIZANIDINE HCL 2 MG PO TABS: ORAL_TABLET | ORAL | 0 refills | Status: DC

## 2021-10-24 ENCOUNTER — Ambulatory Visit (INDEPENDENT_AMBULATORY_CARE_PROVIDER_SITE_OTHER): Payer: Medicare PPO

## 2021-10-24 VITALS — BP 136/82 | HR 59 | Temp 98.4°F | Resp 16 | Ht 64.0 in | Wt 148.3 lb

## 2021-10-24 DIAGNOSIS — Z0000 Encounter for general adult medical examination without abnormal findings: Secondary | ICD-10-CM

## 2021-10-24 NOTE — Patient Instructions (Signed)
Ms. Pietila , ?Thank you for taking time to come for your Medicare Wellness Visit. I appreciate your ongoing commitment to your health goals. Please review the following plan we discussed and let me know if I can assist you in the future.  ? ?Screening recommendations/referrals: ?Colonoscopy: no longer required ?Mammogram: done 02/27/21 ?Bone Density: done 02/25/20 ?Recommended yearly ophthalmology/optometry visit for glaucoma screening and checkup ?Recommended yearly dental visit for hygiene and checkup ? ?Vaccinations: ?Influenza vaccine: done 05/09/21 ?Pneumococcal vaccine: done 09/20/16 ?Tdap vaccine: done 03/01/21 ?Shingles vaccine: Shingrix discussed. Please contact your pharmacy for coverage information.  ?Covid-19: done 08/31/19, 09/30/19, 05/19/20, 11/29/20 & 05/16/21 ? ?Advanced directives: Please bring a copy of your health care power of attorney and living will to the office at your convenience.  ? ?Conditions/risks identified: Keep up the great work! ? ?Next appointment: Follow up in one year for your annual wellness visit  ? ? ?Preventive Care 79 Years and Older, Female ?Preventive care refers to lifestyle choices and visits with your health care provider that can promote health and wellness. ?What does preventive care include? ?A yearly physical exam. This is also called an annual well check. ?Dental exams once or twice a year. ?Routine eye exams. Ask your health care provider how often you should have your eyes checked. ?Personal lifestyle choices, including: ?Daily care of your teeth and gums. ?Regular physical activity. ?Eating a healthy diet. ?Avoiding tobacco and drug use. ?Limiting alcohol use. ?Practicing safe sex. ?Taking low-dose aspirin every day. ?Taking vitamin and mineral supplements as recommended by your health care provider. ?What happens during an annual well check? ?The services and screenings done by your health care provider during your annual well check will depend on your age, overall health,  lifestyle risk factors, and family history of disease. ?Counseling  ?Your health care provider may ask you questions about your: ?Alcohol use. ?Tobacco use. ?Drug use. ?Emotional well-being. ?Home and relationship well-being. ?Sexual activity. ?Eating habits. ?History of falls. ?Memory and ability to understand (cognition). ?Work and work Statistician. ?Reproductive health. ?Screening  ?You may have the following tests or measurements: ?Height, weight, and BMI. ?Blood pressure. ?Lipid and cholesterol levels. These may be checked every 5 years, or more frequently if you are over 16 years old. ?Skin check. ?Lung cancer screening. You may have this screening every year starting at age 36 if you have a 30-pack-year history of smoking and currently smoke or have quit within the past 15 years. ?Fecal occult blood test (FOBT) of the stool. You may have this test every year starting at age 21. ?Flexible sigmoidoscopy or colonoscopy. You may have a sigmoidoscopy every 5 years or a colonoscopy every 10 years starting at age 84. ?Hepatitis C blood test. ?Hepatitis B blood test. ?Sexually transmitted disease (STD) testing. ?Diabetes screening. This is done by checking your blood sugar (glucose) after you have not eaten for a while (fasting). You may have this done every 1-3 years. ?Bone density scan. This is done to screen for osteoporosis. You may have this done starting at age 81. ?Mammogram. This may be done every 1-2 years. Talk to your health care provider about how often you should have regular mammograms. ?Talk with your health care provider about your test results, treatment options, and if necessary, the need for more tests. ?Vaccines  ?Your health care provider may recommend certain vaccines, such as: ?Influenza vaccine. This is recommended every year. ?Tetanus, diphtheria, and acellular pertussis (Tdap, Td) vaccine. You may need a Td booster every 10  years. ?Zoster vaccine. You may need this after age  56. ?Pneumococcal 13-valent conjugate (PCV13) vaccine. One dose is recommended after age 13. ?Pneumococcal polysaccharide (PPSV23) vaccine. One dose is recommended after age 35. ?Talk to your health care provider about which screenings and vaccines you need and how often you need them. ?This information is not intended to replace advice given to you by your health care provider. Make sure you discuss any questions you have with your health care provider. ?Document Released: 08/05/2015 Document Revised: 03/28/2016 Document Reviewed: 05/10/2015 ?Elsevier Interactive Patient Education ? 2017 [employer]. ? ?Fall Prevention in the Home ?Falls can cause injuries. They can happen to people of all ages. There are many things you can do to make your home safe and to help prevent falls. ?What can I do on the outside of my home? ?Regularly fix the edges of walkways and driveways and fix any cracks. ?Remove anything that might make you trip as you walk through a door, such as a raised step or threshold. ?Trim any bushes or trees on the path to your home. ?Use bright outdoor lighting. ?Clear any walking paths of anything that might make someone trip, such as rocks or tools. ?Regularly check to see if handrails are loose or broken. Make sure that both sides of any steps have handrails. ?Any raised decks and porches should have guardrails on the edges. ?Have any leaves, snow, or ice cleared regularly. ?Use sand or salt on walking paths during winter. ?Clean up any spills in your garage right away. This includes oil or grease spills. ?What can I do in the bathroom? ?Use night lights. ?Install grab bars by the toilet and in the tub and shower. Do not use towel bars as grab bars. ?Use non-skid mats or decals in the tub or shower. ?If you need to sit down in the shower, use a plastic, non-slip stool. ?Keep the floor dry. Clean up any water that spills on the floor as soon as it happens. ?Remove soap buildup in the tub or shower  regularly. ?Attach bath mats securely with double-sided non-slip rug tape. ?Do not have throw rugs and other things on the floor that can make you trip. ?What can I do in the bedroom? ?Use night lights. ?Make sure that you have a light by your bed that is easy to reach. ?Do not use any sheets or blankets that are too big for your bed. They should not hang down onto the floor. ?Have a firm chair that has side arms. You can use this for support while you get dressed. ?Do not have throw rugs and other things on the floor that can make you trip. ?What can I do in the kitchen? ?Clean up any spills right away. ?Avoid walking on wet floors. ?Keep items that you use a lot in easy-to-reach places. ?If you need to reach something above you, use a strong step stool that has a grab bar. ?Keep electrical cords out of the way. ?Do not use floor polish or wax that makes floors slippery. If you must use wax, use non-skid floor wax. ?Do not have throw rugs and other things on the floor that can make you trip. ?What can I do with my stairs? ?Do not leave any items on the stairs. ?Make sure that there are handrails on both sides of the stairs and use them. Fix handrails that are broken or loose. Make sure that handrails are as long as the stairways. ?Check any carpeting to make sure  that it is firmly attached to the stairs. Fix any carpet that is loose or worn. ?Avoid having throw rugs at the top or bottom of the stairs. If you do have throw rugs, attach them to the floor with carpet tape. ?Make sure that you have a light switch at the top of the stairs and the bottom of the stairs. If you do not have them, ask someone to add them for you. ?What else can I do to help prevent falls? ?Wear shoes that: ?Do not have high heels. ?Have rubber bottoms. ?Are comfortable and fit you well. ?Are closed at the toe. Do not wear sandals. ?If you use a stepladder: ?Make sure that it is fully opened. Do not climb a closed stepladder. ?Make sure that  both sides of the stepladder are locked into place. ?Ask someone to hold it for you, if possible. ?Clearly mark and make sure that you can see: ?Any grab bars or handrails. ?First and last steps. ?Where the edge of each s

## 2021-10-24 NOTE — Progress Notes (Signed)
? ?Subjective:  ? Yvonne SoldersWyvonia J Andrews is a 81 y.o. female who presents for Medicare Annual (Subsequent) preventive examination. ? ?Review of Systems    ?Cardiac Risk Factors include: advanced age (>255men, 43>65 women);dyslipidemia;hypertension ? ?   ?Objective:  ?  ?Today's Vitals  ? 10/24/21 [id number] 10/24/21 0925  ?BP: 136/82   ?Pulse: (!) 59   ?Resp: 16   ?Temp: 98.4 ?F (36.9 ?C)   ?TempSrc: Oral   ?SpO2: 95%   ?Weight: 148 lb 4.8 oz (67.3 kg)   ?Height: 5\' 4"  (1.626 m)   ?PainSc:  1   ? ?Body mass index is 25.46 kg/m?. ? ? ?  10/24/2021  ?  9:30 AM 10/20/2020  ?  9:31 AM 10/20/2019  ?  8:54 AM 09/09/2018  ?  8:45 AM 09/06/2017  ?  9:00 AM 03/08/2017  ? 10:32 AM 01/21/2017  ?  8:07 AM  ?Advanced Directives  ?Does Patient Have a Medical Advance Directive? Yes Yes Yes Yes No No No  ?Type of Estate agentAdvance Directive Healthcare Power of [place];Living will Healthcare Power of [place]orney;Living will Healthcare Power of [place]orney;Living will Living will;Healthcare Power of Attorney     ?Does patient want to make changes to medical advance directive?  Yes (MAU/Ambulatory/Procedural Areas - Information given)       ?Copy of Healthcare Power of Attorney in Chart? No - copy requested  No - copy requested No - copy requested     ?Would patient like information on creating a medical advance directive?     Yes (MAU/Ambulatory/Procedural Areas - Information given)    ? ? ?Current Medications (verified) ?Outpatient Encounter Medications as of 10/24/2021  ?Medication Sig  ? alendronate (FOSAMAX) 70 MG tablet TAKE 1 TABLET ONE TIME WEEKLY WITH A FULL GLASS OF WATER ON AN EMPTY STOMACH  ? aspirin EC 81 MG tablet Take 1 tablet (81 mg total) by mouth daily.  ? Cholecalciferol (VITAMIN D) 2000 units CAPS Take 1 capsule (2,000 Units total) by mouth daily.  ? hydrochlorothiazide (HYDRODIURIL) 12.5 MG tablet TAKE 1 TABLET BY MOUTH EVERY DAY  ? MULTIPLE VITAMINS-MINERALS ER PO Take 1 tablet by mouth daily.  ? Nutritional Supplements (ESTROVEN PM PO) Take by mouth  daily.  ? rosuvastatin (CRESTOR) 20 MG tablet Take 1 tablet (20 mg total) by mouth daily.  ? timolol (TIMOPTIC) 0.5 % ophthalmic solution Place 1 drop into both eyes 2 (two) times daily.   ? tiZANidine (ZANAFLEX) 2 MG tablet TAKE 1 TABLET BY MOUTH EVERYDAY AT BEDTIME  ? valsartan (DIOVAN) 80 MG tablet TAKE 1 TABLET EVERY DAY  ? traZODone (DESYREL) 50 MG tablet Take 0.5-1 tablets (25-50 mg total) by mouth at bedtime as needed for sleep. (Patient not taking: Reported on 10/24/2021)  ? ?No facility-administered encounter medications on file as of 10/24/2021.  ? ? ?Allergies (verified) ?Lipitor [atorvastatin]  ? ?History: ?Past Medical History:  ?Diagnosis Date  ? Allergy   ? Bilateral tinnitus   ? Cataracts, bilateral   ? Diverticulitis   ? Hyperlipidemia   ? Hypertension   ? Macular degeneration due to cyst or hole, left 2017  ? Osteopenia   ? Trigger finger of right hand   ? Unspecified glaucoma   ? ?Past Surgical History:  ?Procedure Laterality Date  ? TRIGGER FINGER RELEASE    ? TUBAL LIGATION    ? ?Family History  ?Problem Relation Age of Onset  ? Heart disease Mother   ? CAD Father   ? Heart attack Father [id number]  ?  Cancer Brother   ? Heart disease Brother   ? Breast cancer Niece   ?     sisters daughter  ? ?Social History  ? ?Socioeconomic History  ? Marital status: Widowed  ?  Spouse name: Bethann Berkshire  ? Number of children: 4  ? Years of education: Not on file  ? Highest education level: 12th grade  ?Occupational History  ? Occupation: Retired  ?Tobacco Use  ? Smoking status: Never  ? Smokeless tobacco: Never  ? Tobacco comments:  ?  smoking cessation materials not required  ?Vaping Use  ? Vaping Use: Never used  ?Substance and Sexual Activity  ? Alcohol use: No  ?  Alcohol/week: 0.0 standard drinks  ? Drug use: No  ? Sexual activity: Not Currently  ?Other Topics Concern  ? Not on file  ?Social History Narrative  ? Pt lives alone. Husband diagnosed with lung cancer Fall of 2019, he died October 09, 2018 ? One child lives in town.   ? Son Dayton Scrape passed away 12-09-19 in motorcycle accident.   ? ?Social Determinants of Health  ? ?Financial Resource Strain: Low Risk   ? Difficulty of Paying Living Expenses: Not hard at all  ?Food Insecurity: No Food Insecurity  ? Worried About Programme researcher, broadcasting/film/video in the Last Year: Never true  ? Ran Out of Food in the Last Year: Never true  ?Transportation Needs: No Transportation Needs  ? Lack of Transportation (Medical): No  ? Lack of Transportation (Non-Medical): No  ?Physical Activity: Sufficiently Active  ? Days of Exercise per Week: 5 days  ? Minutes of Exercise per Session: 30 min  ?Stress: No Stress Concern Present  ? Feeling of Stress : Not at all  ?Social Connections: Moderately Integrated  ? Frequency of Communication with Friends and Family: More than three times a week  ? Frequency of Social Gatherings with Friends and Family: More than three times a week  ? Attends Religious Services: More than 4 times per year  ? Active Member of Clubs or Organizations: Yes  ? Attends Banker Meetings: More than 4 times per year  ? Marital Status: Widowed  ? ? ?Tobacco Counseling ?Counseling given: Not Answered ?Tobacco comments: smoking cessation materials not required ? ? ?Clinical Intake: ? ?Pre-visit preparation completed: Yes ? ?Pain : 0-10 ?Pain Score: 1  ?Pain Type: Chronic pain ?Pain Location: Knee ?Pain Orientation: Right ?Pain Descriptors / Indicators: Aching, Sore ?Pain Onset: More than a month ago ?Pain Frequency: Intermittent ? ?  ? ?BMI - recorded: 25.46 ?Nutritional Status: BMI 25 -29 Overweight ?Nutritional Risks: None ?Diabetes: No ? ?How often do you need to have someone help you when you read instructions, pamphlets, or other written materials from your doctor or pharmacy?: 1 - Never ? ? ? ?Interpreter Needed?: No ? ?Information entered by :: Reather Littler LPN ? ? ?Activities of Daily Living ? ?  10/24/2021  ?  9:31 AM 08/04/2021  ?  8:25 AM  ?In your present state of health, do you have  any difficulty performing the following activities:  ?Hearing? 0 0  ?Vision? 0 0  ?Difficulty concentrating or making decisions? 0 0  ?Walking or climbing stairs? 0 0  ?Dressing or bathing? 0 0  ?Doing errands, shopping? 0 0  ?Preparing Food and eating ? N   ?Using the Toilet? N   ?In the past six months, have you accidently leaked urine? N   ?Do you have problems with loss of bowel control?  N   ?Managing your Medications? N   ?Managing your Finances? N   ?Housekeeping or managing your Housekeeping? N   ? ? ?Patient Care Team: ?Alba Cory, MD as PCP - General (Family Medicine) ?Pa, [facility] as Consulting Physician (Optometry) ? ?Indicate any recent Medical Services you may have received from other than [facility] providers in the past year (date may be approximate). ? ?   ?Assessment:  ? This is a routine wellness examination for [facility]. ? ?Hearing/Vision screen ?Hearing Screening - Comments:: Pt denies hearing difficulty ?Vision Screening - Comments:: Annual vision screenings with Dr. Melanie Crazier ? ?Dietary issues and exercise activities discussed: ?Current Exercise Habits: Home exercise routine, Type of exercise: walking;Other - see comments (yard work), Time (Minutes): 30, Frequency (Times/Week): 5, Weekly Exercise (Minutes/Week): 150, Intensity: Moderate, Exercise limited by: orthopedic condition(s) ? ? Goals Addressed   ? ?  ?  ?  ?  ? This Visit's Progress  ?  DIET - INCREASE WATER INTAKE   On track  ?  Recommend drinking 6-8 glasses of water per day  ?  ?  Exercise 150 min/wk Moderate Activity   On track  ?  Recommend to exercise for at least 150 minutes per week. ?  ? ?  ? ?Depression Screen ? ?  10/24/2021  ?  9:29 AM 08/04/2021  ?  8:25 AM 05/17/2021  ?  8:11 AM 05/09/2021  ?  2:27 PM 03/30/2021  ? 11:09 AM 03/01/2021  ?  7:46 AM 02/01/2021  ?  7:58 AM  ?PHQ 2/9 Scores  ?PHQ - 2 Score 0 0 0 0 0 0 0  ?PHQ- 9 Score  0       ?  ?Fall Risk ? ?  10/24/2021  ?  9:30 AM 08/04/2021  ?  8:25 AM 05/17/2021  ?  8:10 AM  05/09/2021  ?  2:27 PM 03/30/2021  ? 11:08 AM  ?Fall Risk   ?Falls in the past year? 0 0 0 0 0  ?Number falls in past yr: 0 0 0 0 0  ?Injury with Fall? 0 0 0 0 0  ?Risk for fall due to : No Fall Risks No Fa

## 2021-11-30 ENCOUNTER — Other Ambulatory Visit: Payer: Self-pay | Admitting: Family Medicine

## 2021-11-30 DIAGNOSIS — I10 Essential (primary) hypertension: Secondary | ICD-10-CM

## 2021-12-25 DIAGNOSIS — H40053 Ocular hypertension, bilateral: Secondary | ICD-10-CM | POA: Diagnosis not present

## 2021-12-25 DIAGNOSIS — H2513 Age-related nuclear cataract, bilateral: Secondary | ICD-10-CM | POA: Diagnosis not present

## 2022-01-01 ENCOUNTER — Other Ambulatory Visit: Payer: Self-pay | Admitting: Family Medicine

## 2022-01-01 DIAGNOSIS — E785 Hyperlipidemia, unspecified: Secondary | ICD-10-CM

## 2022-01-08 DIAGNOSIS — H40053 Ocular hypertension, bilateral: Secondary | ICD-10-CM | POA: Diagnosis not present

## 2022-02-01 ENCOUNTER — Ambulatory Visit: Payer: Medicare PPO | Admitting: Family Medicine

## 2022-02-02 ENCOUNTER — Telehealth: Payer: Self-pay | Admitting: Family Medicine

## 2022-02-02 DIAGNOSIS — M8580 Other specified disorders of bone density and structure, unspecified site: Secondary | ICD-10-CM

## 2022-02-02 NOTE — Telephone Encounter (Signed)
Lvm for pt to call and schedule an appt  

## 2022-02-02 NOTE — Telephone Encounter (Signed)
PT called back saying thanks for the refill.  She declined an ppt saying she is going to start going to the [facility].

## 2022-02-05 ENCOUNTER — Other Ambulatory Visit: Payer: Self-pay | Admitting: Family Medicine

## 2022-02-05 DIAGNOSIS — I10 Essential (primary) hypertension: Secondary | ICD-10-CM

## 2022-02-23 ENCOUNTER — Other Ambulatory Visit: Payer: Self-pay | Admitting: Internal Medicine

## 2022-02-23 ENCOUNTER — Ambulatory Visit
Admission: RE | Admit: 2022-02-23 | Discharge: 2022-02-23 | Disposition: A | Discharge status: Home / Self Care | Payer: Medicare PPO | Source: Ambulatory Visit | Attending: Internal Medicine | Admitting: Internal Medicine

## 2022-02-23 DIAGNOSIS — M25511 Pain in right shoulder: Secondary | ICD-10-CM

## 2022-02-23 DIAGNOSIS — M542 Cervicalgia: Secondary | ICD-10-CM

## 2022-03-02 ENCOUNTER — Encounter: Payer: Medicare PPO | Admitting: Family Medicine

## 2022-05-04 ENCOUNTER — Other Ambulatory Visit: Payer: Self-pay | Admitting: Internal Medicine

## 2022-05-04 DIAGNOSIS — M4722 Other spondylosis with radiculopathy, cervical region: Secondary | ICD-10-CM

## 2022-05-22 ENCOUNTER — Ambulatory Visit
Admission: RE | Admit: 2022-05-22 | Discharge: 2022-05-22 | Disposition: A | Discharge status: Home / Self Care | Payer: Medicare PPO | Source: Ambulatory Visit | Attending: Internal Medicine | Admitting: Internal Medicine

## 2022-05-22 DIAGNOSIS — M4722 Other spondylosis with radiculopathy, cervical region: Secondary | ICD-10-CM

## 2022-05-22 MED ORDER — GADOBENATE DIMEGLUMINE 529 MG/ML IV SOLN
13.0000 mL | Freq: Once | INTRAVENOUS | Status: AC | PRN
  Administered 2022-05-22: 13 mL via INTRAVENOUS

## 2022-05-30 ENCOUNTER — Other Ambulatory Visit: Payer: Self-pay | Admitting: Family Medicine

## 2022-05-30 DIAGNOSIS — I10 Essential (primary) hypertension: Secondary | ICD-10-CM

## 2022-07-02 ENCOUNTER — Other Ambulatory Visit: Payer: Self-pay | Admitting: Family Medicine

## 2022-07-02 DIAGNOSIS — M545 Low back pain, unspecified: Secondary | ICD-10-CM

## 2022-07-30 NOTE — Progress Notes (Unsigned)
Referring Physician:  Louis Matte, MD [address] [place],  [place] [address]  Primary Physician:  Louis Matte, MD  History of Present Illness: 07/31/2022 [name] is here today with a chief complaint of right-sided neck and shoulder pain.  This began in May 2023.  It was very severe in the fall, but has substantially improved.  Is now 3-4 out of 10.  Nothing really makes it worse or better.  She did physical therapy which helped.  Turning her head to the right is the most difficult activity for her.    Bowel/Bladder Dysfunction: none  Conservative measures:  Physical therapy: has participated in 4 visits at [facility] but has not been discharged.  Multimodal medical therapy including regular antiinflammatories:  tizanidine, baclofen, tramadol, tylenol arthritis Injections: has not received any epidural steroid injections  Past Surgery: denies  Yvonne Andrews has no symptoms of cervical myelopathy.  The symptoms are causing a significant impact on the patient's life.   I have utilized the care everywhere function in epic to review the outside records available from external health systems.  Review of Systems:  A 10 point review of systems is negative, except for the pertinent positives and negatives detailed in the HPI.  Past Medical History: Past Medical History:  Diagnosis Date   Allergy    Bilateral tinnitus    Cataracts, bilateral    Diverticulitis    Hyperlipidemia    Hypertension    Macular degeneration due to cyst or hole, left 2017   Osteopenia    Trigger finger of right hand    Unspecified glaucoma     Past Surgical History: Past Surgical History:  Procedure Laterality Date   TRIGGER FINGER RELEASE     TUBAL LIGATION      Allergies: Allergies as of 07/31/2022 - Review Complete 07/31/2022  Allergen Reaction Noted   Lipitor [atorvastatin] Other (See Comments) 12/31/2014    Medications: Current Meds  Medication Sig   baclofen  (LIORESAL) 10 MG tablet Take 10 mg by mouth at bedtime as needed.   traMADol (ULTRAM) 50 MG tablet Take 25 mg by mouth daily as needed.    Social History: Social History   Tobacco Use   Smoking status: Never   Smokeless tobacco: Never   Tobacco comments:    smoking cessation materials not required  Vaping Use   Vaping Use: Never used  Substance Use Topics   Alcohol use: No    Alcohol/week: 0.0 standard drinks of alcohol   Drug use: No    Family Medical History: Family History  Problem Relation Age of Onset   Heart disease Mother    CAD Father    Heart attack Father 38   Cancer Brother    Heart disease Brother    Breast cancer Niece        sisters daughter    Physical Examination: Vitals:   07/31/22 1004  BP: (!) 161/79  Pulse: (!) 59    General: Patient is well developed, well nourished, calm, collected, and in no apparent distress. Attention to examination is appropriate.  Neck:   Supple.  Full range of motion with some discomfort on rotation of the right.  Respiratory: Patient is breathing without any difficulty.   NEUROLOGICAL:     Awake, alert, oriented to person, place, and time.  Speech is clear and fluent.   Cranial Nerves: Pupils equal round and reactive to light.  Facial tone is symmetric.  Facial sensation is symmetric.  Shoulder shrug is symmetric. Tongue protrusion is midline.  There is no pronator drift.  ROM of spine: full.    Strength: Side Biceps Triceps Deltoid Interossei Grip Wrist Ext. Wrist Flex.  R 5 5 5 5 5 5 5   L 5 5 5 5 5 5 5    Side Iliopsoas Quads Hamstring PF DF EHL  R 5 5 5 5 5 5   L 5 5 5 5 5 5    Reflexes are 1+ and symmetric at the biceps, triceps, brachioradialis, patella and achilles.   Hoffman's is absent.   Bilateral upper and lower extremity sensation is intact to light touch.    No evidence of dysmetria noted.  Gait is normal.     Medical Decision Making  Imaging: MRI C spine 05/22/22 IMPRESSION: 1. C3-4  large right paracentral extrusion flattening the right cord and impinging on the right intradural C4 nerve roots. 2. C4-5 central disc protrusion flattening the cord. 3. Smaller herniations at C5-6 and C6-7 which contact but do not compress the cord. There is mild to moderate foraminal narrowing at these levels.     Electronically Signed   By: Jorje Guild M.D.   On: 05/25/2022 04:31  I have personally reviewed the images and agree with the above interpretation.  Assessment and Plan: Yvonne Andrews is a pleasant 82 y.o. female with right-sided C4 radiculopathy due to disc herniation at C3-4.  She also has moderate stenosis at C4-5.  She has no myelopathic symptoms.  Her radicular symptoms have substantially improved.  At this point, we will defer further treatment.  If her symptoms return, we will refer her back to physical therapy and consider an epidural steroid injection before consideration of any surgical intervention.  Will schedule follow-up in approximately 2 months.    Thank you for involving me in the care of this patient.      Yvonne Andrews K. Izora Ribas MD, [facility]

## 2022-07-31 ENCOUNTER — Ambulatory Visit: Payer: Medicare PPO | Admitting: Neurosurgery

## 2022-07-31 ENCOUNTER — Encounter: Payer: Self-pay | Admitting: Neurosurgery

## 2022-07-31 VITALS — BP 161/79 | HR 59 | Ht 64.0 in | Wt 141.0 lb

## 2022-07-31 DIAGNOSIS — M5412 Radiculopathy, cervical region: Secondary | ICD-10-CM | POA: Diagnosis not present

## 2022-08-16 ENCOUNTER — Other Ambulatory Visit: Payer: Self-pay | Admitting: Internal Medicine

## 2022-08-16 DIAGNOSIS — M8580 Other specified disorders of bone density and structure, unspecified site: Secondary | ICD-10-CM

## 2022-08-16 DIAGNOSIS — Z13820 Encounter for screening for osteoporosis: Secondary | ICD-10-CM

## 2022-08-20 ENCOUNTER — Other Ambulatory Visit: Payer: Self-pay | Admitting: Family Medicine

## 2022-08-20 DIAGNOSIS — E785 Hyperlipidemia, unspecified: Secondary | ICD-10-CM

## 2022-08-28 ENCOUNTER — Ambulatory Visit
Admission: RE | Admit: 2022-08-28 | Discharge: 2022-08-28 | Disposition: A | Discharge status: Home / Self Care | Payer: Medicare PPO | Source: Ambulatory Visit | Attending: Internal Medicine | Admitting: Internal Medicine

## 2022-08-28 DIAGNOSIS — Z780 Asymptomatic menopausal state: Secondary | ICD-10-CM | POA: Insufficient documentation

## 2022-08-28 DIAGNOSIS — M8580 Other specified disorders of bone density and structure, unspecified site: Secondary | ICD-10-CM | POA: Diagnosis present

## 2022-08-28 DIAGNOSIS — M8589 Other specified disorders of bone density and structure, multiple sites: Secondary | ICD-10-CM | POA: Diagnosis not present

## 2022-08-28 DIAGNOSIS — Z13820 Encounter for screening for osteoporosis: Secondary | ICD-10-CM | POA: Insufficient documentation

## 2022-09-13 ENCOUNTER — Other Ambulatory Visit: Payer: Self-pay | Admitting: Family Medicine

## 2022-09-13 DIAGNOSIS — M8580 Other specified disorders of bone density and structure, unspecified site: Secondary | ICD-10-CM

## 2022-09-28 NOTE — Progress Notes (Unsigned)
Referring Physician:  Waylan Rocher, MD [address] [place],  [place] [address]  Primary Physician:  Waylan Rocher, MD  History of Present Illness:  Last seen by Dr. Izora Ribas on 07/31/22 for right sided neck and shoulder pain. She has known disc herniation at C3-C4 along with moderate stenosis at C4-C5. She was improving at her last visit and had no myelopathic symptoms.   No further treatment done as she was improving. If she got worse, he discussed possible return to PT and discussing injections prior to consideration of any surgery.   She is here for follow up.   She continues to do better. Her neck and right shoulder pain is intermittent and much improved. Pain is tolerable (2 out of 10). She still has some discomfort with turning her head. Overall, she is doing very well.    10/02/2022 Visit with Dr. Izora Ribas Yvonne Andrews is here today with a chief complaint of right-sided neck and shoulder pain.  This began in May 2023.  It was very severe in the fall, but has substantially improved.  Is now 3-4 out of 10.  Nothing really makes it worse or better.  She did physical therapy which helped.  Turning her head to the right is the most difficult activity for her.   Conservative measures:  Physical therapy: has participated in 4 visits at [facility] and then stopped going.  Multimodal medical therapy including regular antiinflammatories:  tizanidine, baclofen, tramadol, tylenol arthritis Injections: has not received any epidural steroid injections  Past Surgery: denies  Yvonne Andrews has no symptoms of cervical myelopathy.  The symptoms are not causing a significant impact on the patient's life.   Review of Systems:  A 10 point review of systems is negative, except for the pertinent positives and negatives detailed in the HPI.  Past Medical History: Past Medical History:  Diagnosis Date   Allergy    Bilateral tinnitus    Cataracts, bilateral    Diverticulitis     Hyperlipidemia    Hypertension    Macular degeneration due to cyst or hole, left 2017   Osteopenia    Trigger finger of right hand    Unspecified glaucoma     Past Surgical History: Past Surgical History:  Procedure Laterality Date   TRIGGER FINGER RELEASE     TUBAL LIGATION      Allergies: Allergies as of 10/02/2022 - Review Complete 10/02/2022  Allergen Reaction Noted   Lipitor [atorvastatin] Other (See Comments) 12/31/2014    Medications: Current Meds  Medication Sig   alendronate (FOSAMAX) 70 MG tablet TAKE 1 TABLET ONE TIME WEEKLY WITH A FULL GLASS OF WATER ON AN EMPTY STOMACH   aspirin EC 81 MG tablet Take 1 tablet (81 mg total) by mouth daily.   baclofen (LIORESAL) 10 MG tablet Take 10 mg by mouth at bedtime as needed.   Cholecalciferol (VITAMIN D) 2000 units CAPS Take 1 capsule (2,000 Units total) by mouth daily.   hydrochlorothiazide (HYDRODIURIL) 12.5 MG tablet TAKE 1 TABLET BY MOUTH EVERY DAY   MULTIPLE VITAMINS-MINERALS ER PO Take 1 tablet by mouth daily.   Netarsudil-Latanoprost (ROCKLATAN) 0.02-0.005 % SOLN Apply 1 drop to eye daily.   Nutritional Supplements (ESTROVEN PM PO) Take by mouth daily.   rosuvastatin (CRESTOR) 20 MG tablet TAKE 1 TABLET EVERY DAY   valsartan (DIOVAN) 80 MG tablet TAKE 1 TABLET EVERY DAY    Social History: Social History   Tobacco Use   Smoking status:  Never   Smokeless tobacco: Never   Tobacco comments:    smoking cessation materials not required  Vaping Use   Vaping Use: Never used  Substance Use Topics   Alcohol use: No    Alcohol/week: 0.0 standard drinks of alcohol   Drug use: No    Family Medical History: Family History  Problem Relation Age of Onset   Heart disease Mother    CAD Father    Heart attack Father 28   Cancer Brother    Heart disease Brother    Breast cancer Niece        sisters daughter    Physical Examination: Vitals:   10/02/22 0926 10/02/22 1009  BP: (!) 154/74 (!) 152/82       Awake, alert, oriented to person, place, and time.  Speech is clear and fluent.   Cranial Nerves: Pupils equal round and reactive to light.  Facial tone is symmetric.   Some pain with looking to right.   Strength: Side Biceps Triceps Deltoid Interossei Grip Wrist Ext. Wrist Flex.  R '5 5 5 5 5 5 5  '$ L '5 5 5 5 5 5 5   '$ Side Iliopsoas Quads Hamstring PF DF EHL  R '5 5 5 5 5 5  '$ L '5 5 5 5 5 5   '$ Reflexes are 1+ and symmetric at the biceps, triceps, brachioradialis, patella and achilles.   Hoffman's is absent.   Bilateral upper and lower extremity sensation is intact to light touch.     Gait is normal.     Medical Decision Making  Imaging: None  Assessment and Plan: Yvonne Andrews is a pleasant 82 y.o. female is feeling much better!  She has intermittent right sided neck pain into her shoulder. No current arm pain. Pain is minimal and tolerable. No symptoms of myelopathy.   She has known right-sided C4 radiculopathy due to disc herniation at C3-4.  She also has moderate stenosis at C4-5.    Treatment options discussed with patient and following plan made:   - Continue with HEP from PT.  - If she gets worse, consider revisiting PT and/or possible cervical injections.  - Reviewed symptoms of myelopathy- she will call if she develops these.  - She requests to follow up prn.   BP was elevated, it was 154/74 and then 152/82. No symptoms of chest pain, shortness of breath, blurry vision, or headaches. Her blood pressure generally runs high at doctors.   She checks BP at home and it generally runs 120s/80s (was around 124/80s this morning). Will recheck at home and call PCP if not improved. If she develops CP, SOB, blurry vision, or headaches, then she will go to ED.     I spent a total of 15 minutes in face-to-face and non-face-to-face activities related to this patient's care today including review of outside records, review of imaging, review of symptoms, physical exam, discussion of  differential diagnosis, discussion of treatment options, and documentation.   Geronimo Boot PA-C Dept. of Neurosurgery

## 2022-10-02 ENCOUNTER — Ambulatory Visit: Payer: Medicare PPO | Admitting: Orthopedic Surgery

## 2022-10-02 ENCOUNTER — Encounter: Payer: Self-pay | Admitting: Orthopedic Surgery

## 2022-10-02 VITALS — BP 152/82 | Ht 64.0 in | Wt 141.0 lb

## 2022-10-02 DIAGNOSIS — M5020 Other cervical disc displacement, unspecified cervical region: Secondary | ICD-10-CM | POA: Diagnosis not present

## 2022-10-02 DIAGNOSIS — M4802 Spinal stenosis, cervical region: Secondary | ICD-10-CM | POA: Diagnosis not present

## 2022-10-02 NOTE — Patient Instructions (Signed)
It was so nice to see you today. Thank you so much for coming in.    I am so glad that you are doing well!  Continue with your exercises.   Your blood pressure was elevated today, it was 152/82. I want you to recheck it at home and follow up with your PCP if it remains high. If you have any chest pain, shortness of breath, blurry vision, or headaches then you need to go to ED.    Please do not hesitate to call if you have any questions or concerns. You can also message me in [place].   Geronimo Boot PA-C [phone]

## 2022-10-18 ENCOUNTER — Other Ambulatory Visit: Payer: Self-pay | Admitting: Family Medicine

## 2022-10-18 ENCOUNTER — Ambulatory Visit
Admission: RE | Admit: 2022-10-18 | Discharge: 2022-10-18 | Disposition: A | Discharge status: Home / Self Care | Payer: Medicare PPO | Source: Ambulatory Visit | Attending: Family Medicine | Admitting: Family Medicine

## 2022-10-18 DIAGNOSIS — M26609 Unspecified temporomandibular joint disorder, unspecified side: Secondary | ICD-10-CM

## 2022-11-06 ENCOUNTER — Other Ambulatory Visit: Payer: Self-pay | Admitting: Family Medicine

## 2022-11-06 DIAGNOSIS — E785 Hyperlipidemia, unspecified: Secondary | ICD-10-CM

## 2024-08-28 ENCOUNTER — Other Ambulatory Visit: Payer: Self-pay | Admitting: Internal Medicine

## 2024-08-28 DIAGNOSIS — M8589 Other specified disorders of bone density and structure, multiple sites: Secondary | ICD-10-CM
